# Patient Record
Sex: Female | Born: 1980
Health system: Southern US, Community
[De-identification: ages and names within clinical notes are randomized; demographics above are authoritative.]

## PROBLEM LIST (undated history)

## (undated) ENCOUNTER — Inpatient Hospital Stay (HOSPITAL_COMMUNITY): Payer: Self-pay

## (undated) DIAGNOSIS — R112 Nausea with vomiting, unspecified: Secondary | ICD-10-CM

## (undated) DIAGNOSIS — O139 Gestational [pregnancy-induced] hypertension without significant proteinuria, unspecified trimester: Secondary | ICD-10-CM

## (undated) DIAGNOSIS — Z9889 Other specified postprocedural states: Secondary | ICD-10-CM

## (undated) DIAGNOSIS — J189 Pneumonia, unspecified organism: Secondary | ICD-10-CM

## (undated) DIAGNOSIS — K589 Irritable bowel syndrome without diarrhea: Secondary | ICD-10-CM

## (undated) DIAGNOSIS — E229 Hyperfunction of pituitary gland, unspecified: Secondary | ICD-10-CM

## (undated) DIAGNOSIS — G43909 Migraine, unspecified, not intractable, without status migrainosus: Secondary | ICD-10-CM

## (undated) DIAGNOSIS — F419 Anxiety disorder, unspecified: Secondary | ICD-10-CM

## (undated) DIAGNOSIS — Z87442 Personal history of urinary calculi: Secondary | ICD-10-CM

## (undated) HISTORY — DX: Irritable bowel syndrome, unspecified: K58.9

## (undated) HISTORY — DX: Migraine, unspecified, not intractable, without status migrainosus: G43.909

## (undated) HISTORY — DX: Pneumonia, unspecified organism: J18.9

## (undated) HISTORY — DX: Anxiety disorder, unspecified: F41.9

## (undated) HISTORY — DX: Hyperfunction of pituitary gland, unspecified: E22.9

## (undated) HISTORY — PX: WISDOM TOOTH EXTRACTION: SHX21

## (undated) HISTORY — DX: Gestational (pregnancy-induced) hypertension without significant proteinuria, unspecified trimester: O13.9

---
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
## 2002-03-21 HISTORY — PX: FOOT FRACTURE SURGERY: SHX645

## 2002-06-24 ENCOUNTER — Other Ambulatory Visit: Admission: RE | Admit: 2002-06-24 | Discharge: 2002-06-24 | Payer: Self-pay | Admitting: Gynecology

## 2002-09-23 ENCOUNTER — Emergency Department (HOSPITAL_COMMUNITY): Admission: EM | Admit: 2002-09-23 | Discharge: 2002-09-24 | Payer: Self-pay | Admitting: Emergency Medicine

## 2002-09-26 ENCOUNTER — Encounter: Payer: Self-pay | Admitting: Family Medicine

## 2002-09-26 ENCOUNTER — Ambulatory Visit (HOSPITAL_COMMUNITY): Admission: RE | Admit: 2002-09-26 | Discharge: 2002-09-26 | Payer: Self-pay | Admitting: Family Medicine

## 2002-09-30 ENCOUNTER — Encounter: Payer: Self-pay | Admitting: Emergency Medicine

## 2002-09-30 ENCOUNTER — Emergency Department (HOSPITAL_COMMUNITY): Admission: EM | Admit: 2002-09-30 | Discharge: 2002-09-30 | Payer: Self-pay | Admitting: Emergency Medicine

## 2003-12-10 ENCOUNTER — Emergency Department (HOSPITAL_COMMUNITY): Admission: EM | Admit: 2003-12-10 | Discharge: 2003-12-10 | Payer: Self-pay | Admitting: *Deleted

## 2004-02-26 ENCOUNTER — Other Ambulatory Visit: Admission: RE | Admit: 2004-02-26 | Discharge: 2004-02-26 | Payer: Self-pay | Admitting: Gynecology

## 2006-10-11 ENCOUNTER — Other Ambulatory Visit: Admission: RE | Admit: 2006-10-11 | Discharge: 2006-10-11 | Payer: Self-pay | Admitting: Obstetrics and Gynecology

## 2006-10-20 DIAGNOSIS — R7989 Other specified abnormal findings of blood chemistry: Secondary | ICD-10-CM

## 2006-10-20 HISTORY — DX: Other specified abnormal findings of blood chemistry: R79.89

## 2006-11-12 ENCOUNTER — Encounter: Admission: RE | Admit: 2006-11-12 | Discharge: 2006-11-12 | Payer: Self-pay | Admitting: Obstetrics and Gynecology

## 2007-11-21 ENCOUNTER — Emergency Department (HOSPITAL_BASED_OUTPATIENT_CLINIC_OR_DEPARTMENT_OTHER): Admission: EM | Admit: 2007-11-21 | Discharge: 2007-11-21 | Payer: Self-pay | Admitting: Emergency Medicine

## 2008-02-08 ENCOUNTER — Ambulatory Visit: Payer: Self-pay | Admitting: Women's Health

## 2008-02-18 ENCOUNTER — Other Ambulatory Visit: Admission: RE | Admit: 2008-02-18 | Discharge: 2008-02-18 | Payer: Self-pay | Admitting: Obstetrics and Gynecology

## 2008-02-18 ENCOUNTER — Encounter: Payer: Self-pay | Admitting: Women's Health

## 2008-02-18 ENCOUNTER — Ambulatory Visit: Payer: Self-pay | Admitting: Women's Health

## 2008-03-21 DIAGNOSIS — J189 Pneumonia, unspecified organism: Secondary | ICD-10-CM

## 2008-03-21 HISTORY — DX: Pneumonia, unspecified organism: J18.9

## 2009-02-18 ENCOUNTER — Other Ambulatory Visit: Admission: RE | Admit: 2009-02-18 | Discharge: 2009-02-18 | Payer: Self-pay | Admitting: Obstetrics and Gynecology

## 2009-02-18 ENCOUNTER — Ambulatory Visit: Payer: Self-pay | Admitting: Women's Health

## 2010-02-19 ENCOUNTER — Other Ambulatory Visit
Admission: RE | Admit: 2010-02-19 | Discharge: 2010-02-19 | Payer: Self-pay | Source: Home / Self Care | Admitting: Obstetrics and Gynecology

## 2010-02-19 ENCOUNTER — Ambulatory Visit: Payer: Self-pay | Admitting: Women's Health

## 2010-04-10 ENCOUNTER — Encounter: Payer: Self-pay | Admitting: Orthopedic Surgery

## 2010-11-01 ENCOUNTER — Ambulatory Visit (INDEPENDENT_AMBULATORY_CARE_PROVIDER_SITE_OTHER): Payer: PRIVATE HEALTH INSURANCE | Admitting: Women's Health

## 2010-11-01 ENCOUNTER — Encounter: Payer: Self-pay | Admitting: Women's Health

## 2010-11-01 VITALS — BP 130/72

## 2010-11-01 DIAGNOSIS — N926 Irregular menstruation, unspecified: Secondary | ICD-10-CM

## 2010-11-01 DIAGNOSIS — O3680X0 Pregnancy with inconclusive fetal viability, not applicable or unspecified: Secondary | ICD-10-CM

## 2010-11-01 NOTE — Progress Notes (Signed)
Presents with a positive home U PT. Had been on Ortho Tri-Cyclen, but states may have missed 1 early in the pack of pills. States has felt tired, some breast tenderness denies any bleeding or pain. U PT here in the office is positive, we'll check an ultrasound at the end of the month for viability. Will continue on prenatal vitamin daily.Avoid tuna,  unpasteurized cheeses, over-the-counter medications or alcohol. She is aware we no longer deliver, and will seek prenatal care elsewhere. Congratulations were given.

## 2010-11-09 DIAGNOSIS — Z789 Other specified health status: Secondary | ICD-10-CM | POA: Insufficient documentation

## 2010-11-09 DIAGNOSIS — F419 Anxiety disorder, unspecified: Secondary | ICD-10-CM | POA: Insufficient documentation

## 2010-11-09 DIAGNOSIS — J189 Pneumonia, unspecified organism: Secondary | ICD-10-CM | POA: Insufficient documentation

## 2010-11-15 ENCOUNTER — Other Ambulatory Visit: Payer: Self-pay

## 2010-11-15 ENCOUNTER — Ambulatory Visit (INDEPENDENT_AMBULATORY_CARE_PROVIDER_SITE_OTHER): Payer: PRIVATE HEALTH INSURANCE | Admitting: Women's Health

## 2010-11-15 ENCOUNTER — Other Ambulatory Visit: Payer: PRIVATE HEALTH INSURANCE

## 2010-11-15 ENCOUNTER — Encounter: Payer: Self-pay | Admitting: Women's Health

## 2010-11-15 VITALS — BP 130/72

## 2010-11-15 DIAGNOSIS — O9989 Other specified diseases and conditions complicating pregnancy, childbirth and the puerperium: Secondary | ICD-10-CM

## 2010-11-15 DIAGNOSIS — N926 Irregular menstruation, unspecified: Secondary | ICD-10-CM

## 2010-11-15 DIAGNOSIS — O34519 Maternal care for incarceration of gravid uterus, unspecified trimester: Secondary | ICD-10-CM

## 2010-11-15 DIAGNOSIS — O3680X0 Pregnancy with inconclusive fetal viability, not applicable or unspecified: Secondary | ICD-10-CM

## 2010-11-15 DIAGNOSIS — N912 Amenorrhea, unspecified: Secondary | ICD-10-CM

## 2010-11-15 LAB — US OB TRANSVAGINAL

## 2010-11-15 NOTE — Progress Notes (Signed)
  Presents for viability ultrasound. No complaints of pain, cramping, bleeding.  Ultrasound confirms living IUP, Size =dates, [redacted]w[redacted]d,  normal shaped yoke sac seen, fetal pole with positive fetal heart noted. Copy was given she will schedule new OB appointment. She is planning to go to [facility] office.  Will continue prenatal vitamins, she is aware of safe behaviors in pregnancy, aware to avoid over-the-counter meds, no alcohol, avoid tuna, unpasteurized cheeses. Congratulations were given.

## 2010-11-25 ENCOUNTER — Other Ambulatory Visit (HOSPITAL_COMMUNITY): Payer: Self-pay | Admitting: Obstetrics and Gynecology

## 2010-11-25 DIAGNOSIS — O209 Hemorrhage in early pregnancy, unspecified: Secondary | ICD-10-CM

## 2010-11-26 ENCOUNTER — Ambulatory Visit (HOSPITAL_COMMUNITY)
Admission: RE | Admit: 2010-11-26 | Discharge: 2010-11-26 | Disposition: A | Discharge status: Home / Self Care | Payer: PRIVATE HEALTH INSURANCE | Source: Ambulatory Visit | Attending: Obstetrics and Gynecology | Admitting: Obstetrics and Gynecology

## 2010-11-26 DIAGNOSIS — O209 Hemorrhage in early pregnancy, unspecified: Secondary | ICD-10-CM | POA: Insufficient documentation

## 2010-12-08 ENCOUNTER — Encounter: Payer: Self-pay | Admitting: Women's Health

## 2010-12-13 LAB — ANTIBODY SCREEN: Antibody Screen: NEGATIVE

## 2010-12-13 LAB — HEPATITIS B SURFACE ANTIGEN: Hepatitis B Surface Ag: NEGATIVE

## 2010-12-13 LAB — RUBELLA ANTIBODY, IGM: Rubella: NON-IMMUNE/NOT IMMUNE

## 2010-12-13 LAB — ABO/RH: RH Type: NEGATIVE

## 2010-12-22 LAB — BASIC METABOLIC PANEL
Calcium: 9.8
GFR calc Af Amer: >60
GFR calc non Af Amer: >60
Glucose, Bld: 99
Sodium: 142

## 2010-12-22 LAB — POCT TOXICOLOGY PANEL: Benzodiazepines: POSITIVE

## 2010-12-22 LAB — DIFFERENTIAL
Basophils Absolute: 0
Lymphocytes Relative: 39
Monocytes Absolute: 0.5
Neutro Abs: 3.1

## 2010-12-22 LAB — GLUCOSE, CAPILLARY: Glucose-Capillary: 110 — ABNORMAL HIGH

## 2010-12-22 LAB — CBC
Hemoglobin: 14.1
RBC: 4.56
RDW: 12.2

## 2011-03-22 NOTE — L&D Delivery Note (Signed)
Delivery Note Pt pushed great for about an hour and at 8:48 PM a viable female was delivered via Vaginal, Spontaneous Delivery (Presentation: ; Occiput Anterior).  APGAR 6,9:  weight 7#14oz .  Baby had tight nuchal delivered through and was pale after delivery.  Responded great to stimulation and blow-by O2.. Placenta status: Intact, Spontaneous.  Cord: 3 vessels   Anesthesia: Epidural  Episiotomy: None Lacerations: 1st degree;Perineal Suture Repair: 2.0 vicryl rapide Est. Blood Loss (mL): 350cc  Mom to postpartum.  Baby to nursery-stable D/w pt circumcision in detail, risks and benfits reviewed--pt desires to proceed.  Oliver Pila 06/27/2011, 9:10 PM

## 2011-06-03 ENCOUNTER — Encounter (HOSPITAL_COMMUNITY): Payer: Self-pay | Admitting: Obstetrics and Gynecology

## 2011-06-03 ENCOUNTER — Inpatient Hospital Stay (HOSPITAL_COMMUNITY)
Admission: RE | Admit: 2011-06-03 | Discharge: 2011-06-03 | Disposition: A | Discharge status: Home / Self Care | Payer: PRIVATE HEALTH INSURANCE | Source: Ambulatory Visit | Attending: Obstetrics and Gynecology | Admitting: Obstetrics and Gynecology

## 2011-06-03 DIAGNOSIS — O139 Gestational [pregnancy-induced] hypertension without significant proteinuria, unspecified trimester: Secondary | ICD-10-CM | POA: Insufficient documentation

## 2011-06-03 DIAGNOSIS — IMO0002 Reserved for concepts with insufficient information to code with codable children: Secondary | ICD-10-CM

## 2011-06-03 LAB — COMPREHENSIVE METABOLIC PANEL
BUN: 6 mg/dL (ref 6–23)
CO2: 23 mEq/L (ref 19–32)
Chloride: 104 mEq/L (ref 96–112)
Creatinine, Ser: 0.53 mg/dL (ref 0.50–1.10)
GFR calc Af Amer: >90 mL/min (ref >90)
GFR calc non Af Amer: >90 mL/min (ref >90)
Glucose, Bld: 97 mg/dL (ref 70–99)
Total Bilirubin: 0.3 mg/dL (ref 0.3–1.2)

## 2011-06-03 LAB — URIC ACID: Uric Acid, Serum: 3.1 mg/dL (ref 2.4–7.0)

## 2011-06-03 LAB — URINALYSIS, ROUTINE W REFLEX MICROSCOPIC
Leukocytes, UA: NEGATIVE
Nitrite: NEGATIVE
Specific Gravity, Urine: <1.005 — ABNORMAL LOW (ref 1.005–1.030)
Urobilinogen, UA: 0.2 mg/dL (ref 0.0–1.0)
pH: 6.5 (ref 5.0–8.0)

## 2011-06-03 LAB — CBC
Platelets: 169 10*3/uL (ref 150–400)
RBC: 3.95 MIL/uL (ref 3.87–5.11)
WBC: 11.6 10*3/uL — ABNORMAL HIGH (ref 4.0–10.5)

## 2011-06-03 NOTE — MAU Note (Signed)
"  I have had elevated BP & H/A for the past (last Friday when I went to the doctor).  I went back yesterday because of my BP and it was still up.  My doctor told me to rest and that's what I have done all day today.  I took my BP this morning and it was 147/94.  Later on it was 172/90.  I called the doctor and he told me to rest for about 30 mins and then retake it.  It was then 170/96 and now I'm here.  No bleeding or vaginal d/c.  (+) FM."

## 2011-06-03 NOTE — MAU Provider Note (Signed)
History     CSN: [id number]  Arrival date and time: 06/03/11 2036   None     Chief Complaint  Patient presents with  . Hypertension   HPI This is a 31 year old G1 P0 at 35 weeks and 6 days who presents the MAU with elevated blood pressure. She took her blood pressure at home and found her blood pressure to be 170s systolically. She has had a headache for the majority of the day today and intermittently over the past week. She denies scotoma and peripheral edema. She denies decreased fetal activity, vaginal bleeding, vaginal discharge.  OB History    Grav Para Term Preterm Abortions TAB SAB Ect Mult Living   1         0      Past Medical History  Diagnosis Date  . Anxiety   . Pneumonia 2010  . Elevated prolactin level 10/2006    NEGATIVE MRI  . Rubella immune 02/2009    POSITIVE RUBELLA TITER    Past Surgical History  Procedure Date  . Tonsillectomy 1991  . Rt foot  2004    removed a bone from a fx foot    Family History  Problem Relation Age of Onset  . Hypertension Mother   . Diabetes Father   . Hypertension Father   . Diabetes Maternal Grandmother     History  Substance Use Topics  . Smoking status: Never Smoker   . Smokeless tobacco: Never Used  . Alcohol Use: No    Allergies: No Known Allergies  Prescriptions prior to admission  Medication Sig Dispense Refill  . acetaminophen (TYLENOL) 500 MG tablet Take 500 mg by mouth every 6 (six) hours as needed. For headache      . Calcium Carbonate Antacid (TUMS PO) Take 1 tablet by mouth daily.      . Prenatal Vit-Fe Fumarate-FA (PRENATAL MULTIVITAMIN) TABS Take 1 tablet by mouth daily.      . ranitidine (ZANTAC) 150 MG capsule Take 150 mg by mouth 2 (two) times daily.        ROS Physical Exam   Blood pressure 146/95, pulse 104, temperature 98.4 F (36.9 C), temperature source Oral, resp. rate 18, height 5\' 4"  (1.626 m), weight 73.483 kg (162 lb), last menstrual period 09/30/2010.  Physical Exam    Constitutional: She is oriented to person, place, and time. She appears well-developed and well-nourished.  Cardiovascular: Normal rate.   Respiratory: Effort normal.  GI: Soft. Bowel sounds are normal. She exhibits no distension and no mass. There is no tenderness. There is no rebound and no guarding.       Fundal height at term  Musculoskeletal: Normal range of motion. She exhibits no edema and no tenderness.  Neurological: She is alert and oriented to person, place, and time.  Skin: Skin is warm and dry. No rash noted. No erythema. No pallor.  Psychiatric: She has a normal mood and affect. Her behavior is normal. Judgment and thought content normal.   Results for orders placed during the hospital encounter of 06/03/11 (from the past 24 hour(s))  URINALYSIS, ROUTINE W REFLEX MICROSCOPIC     Status: Abnormal   Collection Time   06/03/11  8:35 PM      Component Value Range   Color, Urine YELLOW  YELLOW    APPearance CLEAR  CLEAR    Specific Gravity, Urine <1.005 (*) 1.005 - 1.030    pH 6.5  5.0 - 8.0    Glucose, UA NEGATIVE  NEGATIVE (mg/dL)   Hgb urine dipstick NEGATIVE  NEGATIVE    Bilirubin Urine NEGATIVE  NEGATIVE    Ketones, ur NEGATIVE  NEGATIVE (mg/dL)   Protein, ur NEGATIVE  NEGATIVE (mg/dL)   Urobilinogen, UA 0.2  0.0 - 1.0 (mg/dL)   Nitrite NEGATIVE  NEGATIVE    Leukocytes, UA NEGATIVE  NEGATIVE   COMPREHENSIVE METABOLIC PANEL     Status: Abnormal   Collection Time   06/03/11  9:13 PM      Component Value Range   Sodium 136  135 - 145 (mEq/L)   Potassium 3.7  3.5 - 5.1 (mEq/L)   Chloride 104  96 - 112 (mEq/L)   CO2 23  19 - 32 (mEq/L)   Glucose, Bld 97  70 - 99 (mg/dL)   BUN 6  6 - 23 (mg/dL)   Creatinine, Ser 4.09  0.50 - 1.10 (mg/dL)   Calcium 9.3  8.4 - 81.1 (mg/dL)   Total Protein 5.8 (*) 6.0 - 8.3 (g/dL)   Albumin 2.8 (*) 3.5 - 5.2 (g/dL)   AST 18  0 - 37 (U/L)   ALT 19  0 - 35 (U/L)   Alkaline Phosphatase 113  39 - 117 (U/L)   Total Bilirubin 0.3  0.3 - 1.2  (mg/dL)   GFR calc non Af Amer >90  >90 (mL/min)   GFR calc Af Amer >90  >90 (mL/min)  CBC     Status: Abnormal   Collection Time   06/03/11  9:13 PM      Component Value Range   WBC 11.6 (*) 4.0 - 10.5 (K/uL)   RBC 3.95  3.87 - 5.11 (MIL/uL)   Hemoglobin 12.1  12.0 - 15.0 (g/dL)   HCT 91.4 (*) 78.2 - 46.0 (%)   MCV 90.6  78.0 - 100.0 (fL)   MCH 30.6  26.0 - 34.0 (pg)   MCHC 33.8  30.0 - 36.0 (g/dL)   RDW 95.6  21.3 - 08.6 (%)   Platelets 169  150 - 400 (K/uL)  LACTATE DEHYDROGENASE     Status: Normal   Collection Time   06/03/11  9:13 PM      Component Value Range   LD 182  94 - 250 (U/L)  URIC ACID     Status: Normal   Collection Time   06/03/11  9:13 PM      Component Value Range   Uric Acid, Serum 3.1  2.4 - 7.0 (mg/dL)   NST shows Category 1 tracing with baseline approximately 140. Accelerations are noted. MAU Course  Procedures  MDM No proteinuria, LFTs are normal, uric acid and LDH are normal. Discussed patient with Dr. Ambrose Mantle ovary the patient does not meet criteria for preeclampsia. We'll send patient home with preeclampsia warnings. The patient to followup on Monday at primary obstetrician's office for an NST.   Assessment and Plan  #1 uterine pregnancy at 35 weeks and 6 days #2 gestational hypertension  We'll send patient home with precautions as stated above.  Lenardo Westwood JEHIEL 06/03/2011, 9:54 PM

## 2011-06-03 NOTE — Discharge Instructions (Signed)
Hypertension During Pregnancy Hypertension is also called high blood pressure. Blood pressure moves blood in your body. Sometimes, the force that moves the blood becomes too strong. When you are pregnant, this condition should be watched carefully. It can cause problems for you and your baby. HOME CARE   Make and keep all of your doctor visits.   Take medicine as told by your doctor. Tell your doctor about all medicines you take.   Eat very little salt.   Exercise regularly.   Do not drink alcohol.   Do not smoke.   Do not have drinks with caffeine.   Lie on your left side when resting.  GET HELP RIGHT AWAY IF:  You have bad belly (abdominal) pain.   You have sudden puffiness (swelling) in the hands, ankles, or face.   You gain 4 pounds (1.8 kilograms) or more in 1 week.   You throw up (vomit) repeatedly.   You have bleeding from the vagina.   You do not feel the baby moving as much.   You have a headache.   You have blurred or double vision.   You have muscle twitching or spasms.   You have shortness of breath.   You have blue fingernails and lips.   You have blood in your pee (urine).  MAKE SURE YOU:  Understand these instructions.   Will watch your condition.   Will get help right away if you are not doing well.  Document Released: 04/09/2010 Document Revised: 02/24/2011 Document Reviewed: 10/22/2010 [employer] Patient Information 2012 [employer]. 

## 2011-06-06 LAB — STREP B DNA PROBE: GBS: POSITIVE

## 2011-06-13 ENCOUNTER — Telehealth (HOSPITAL_COMMUNITY): Payer: Self-pay | Admitting: *Deleted

## 2011-06-13 ENCOUNTER — Encounter (HOSPITAL_COMMUNITY): Payer: Self-pay | Admitting: *Deleted

## 2011-06-13 NOTE — Telephone Encounter (Signed)
Preadmission screen  

## 2011-06-18 ENCOUNTER — Inpatient Hospital Stay (HOSPITAL_COMMUNITY)
Admission: AD | Admit: 2011-06-18 | Discharge: 2011-06-18 | Disposition: A | Discharge status: Home / Self Care | Payer: PRIVATE HEALTH INSURANCE | Source: Ambulatory Visit | Attending: Obstetrics and Gynecology | Admitting: Obstetrics and Gynecology

## 2011-06-18 ENCOUNTER — Encounter (HOSPITAL_COMMUNITY): Payer: Self-pay | Admitting: *Deleted

## 2011-06-18 DIAGNOSIS — R030 Elevated blood-pressure reading, without diagnosis of hypertension: Secondary | ICD-10-CM | POA: Insufficient documentation

## 2011-06-18 DIAGNOSIS — O99891 Other specified diseases and conditions complicating pregnancy: Secondary | ICD-10-CM | POA: Insufficient documentation

## 2011-06-18 DIAGNOSIS — M549 Dorsalgia, unspecified: Secondary | ICD-10-CM | POA: Insufficient documentation

## 2011-06-18 DIAGNOSIS — R51 Headache: Secondary | ICD-10-CM | POA: Insufficient documentation

## 2011-06-18 DIAGNOSIS — R42 Dizziness and giddiness: Secondary | ICD-10-CM | POA: Insufficient documentation

## 2011-06-18 LAB — LACTATE DEHYDROGENASE: LDH: 172 U/L (ref 94–250)

## 2011-06-18 LAB — URINALYSIS, ROUTINE W REFLEX MICROSCOPIC
Hgb urine dipstick: NEGATIVE
Ketones, ur: NEGATIVE mg/dL
Protein, ur: NEGATIVE mg/dL
Urobilinogen, UA: 0.2 mg/dL (ref 0.0–1.0)

## 2011-06-18 LAB — COMPREHENSIVE METABOLIC PANEL
AST: 19 U/L (ref 0–37)
Albumin: 2.9 g/dL — ABNORMAL LOW (ref 3.5–5.2)
BUN: 6 mg/dL (ref 6–23)
Calcium: 9.1 mg/dL (ref 8.4–10.5)
Chloride: 103 mEq/L (ref 96–112)
Creatinine, Ser: 0.44 mg/dL — ABNORMAL LOW (ref 0.50–1.10)
Total Protein: 6.4 g/dL (ref 6.0–8.3)

## 2011-06-18 LAB — URINE MICROSCOPIC-ADD ON

## 2011-06-18 LAB — CBC
HCT: 35.6 % — ABNORMAL LOW (ref 36.0–46.0)
MCHC: 34.6 g/dL (ref 30.0–36.0)
MCV: 89.9 fL (ref 78.0–100.0)
Platelets: 172 10*3/uL (ref 150–400)
RDW: 13.7 % (ref 11.5–15.5)
WBC: 12.4 10*3/uL — ABNORMAL HIGH (ref 4.0–10.5)

## 2011-06-18 LAB — URIC ACID: Uric Acid, Serum: 3 mg/dL (ref 2.4–7.0)

## 2011-06-18 NOTE — MAU Note (Signed)
Patient has been followed for elevated bp has headache , back pain and dizziness, has been on bed rest at home, 166/115 at home.  39 weeks

## 2011-06-18 NOTE — Discharge Instructions (Signed)

## 2011-06-18 NOTE — MAU Provider Note (Signed)
  History     CSN: [id number]  Arrival date and time: 06/18/11 1401   None     Chief Complaint  Patient presents with  . Hypertension  . Dizziness  . Back Pain  . Headache   HPI   Past Medical History  Diagnosis Date  . Anxiety   . Pneumonia 2010  . Elevated prolactin level 10/2006    NEGATIVE MRI  . Asthma     exercise induced  . IBS (irritable bowel syndrome)   . Chronic kidney disease     kidney stone  . Pregnancy induced hypertension     Past Surgical History  Procedure Date  . Tonsillectomy 1991  . Rt foot  2004    removed a bone from a fx foot  . Wisdom tooth extraction     Family History  Problem Relation Age of Onset  . Hypertension Mother   . Arthritis Mother   . Diabetes Father   . Hypertension Father   . Diabetes Maternal Grandmother   . COPD Maternal Grandmother   . Hypertension Maternal Grandmother   . Ovarian cysts Maternal Grandmother   . Arthritis Maternal Grandmother   . Hypertension Maternal Grandfather   . Hypertension Paternal Grandmother   . Cancer Paternal Grandmother     lung  . Hypertension Paternal Grandfather   . Muscular dystrophy Cousin   . Diabetes Paternal Aunt   . Anesthesia problems Neg Hx     History  Substance Use Topics  . Smoking status: Never Smoker   . Smokeless tobacco: Never Used  . Alcohol Use: No    Allergies: No Known Allergies  Prescriptions prior to admission  Medication Sig Dispense Refill  . acetaminophen (TYLENOL) 500 MG tablet Take 500 mg by mouth every 6 (six) hours as needed. For headache      . albuterol (PROVENTIL HFA;VENTOLIN HFA) 108 (90 BASE) MCG/ACT inhaler Inhale 2 puffs into the lungs every 6 (six) hours as needed. For shortness of breath      . Calcium Carbonate Antacid (TUMS PO) Take 1 tablet by mouth daily.      . Prenatal Vit-Fe Fumarate-FA (PRENATAL MULTIVITAMIN) TABS Take 1 tablet by mouth daily.      . ranitidine (ZANTAC) 150 MG capsule Take 150 mg by mouth 2 (two) times  daily.        ROS Physical Exam   Blood pressure 149/98, pulse 111, height 5\' 4"  (1.626 m), weight 77.837 kg (171 lb 9.6 oz), last menstrual period 09/30/2010.  Physical Exam Abdomen is soft and not tender. Cervix FT long and the vertex is at -3 station.  MAU Course  Procedures Serial BP's have returned to the levels she has had for some time. The labs are normal so she does not have preeclampsia.   MDM   Assessment and Plan  I have advised the pt to rest and call if she has a severe HA or epigastric pain. She is to return to see Dr. Senaida Ores on 06-20-11. She is to call if BP  160/100. Tyheim Vanalstyne F 06/18/2011, 3:42 PM

## 2011-06-18 NOTE — MAU Note (Signed)
Dr Ambrose Mantle called unit, labs read off to MD. Plan, will see pt again and send home.

## 2011-06-18 NOTE — MAU Note (Signed)
Pt reports having increasing in BP over past few weeks and today it was the highest at home. She reports having a headache all over there head with dizziness and spots only when looks at something close. She reports seeing "starburst" when up the bathroom. She states she is having a slight increase in swelling in her feet,legs, hands and face.

## 2011-06-18 NOTE — MAU Note (Signed)
Dr Ambrose Mantle at bedside discussing labs and discharge to home with pt and spouse.

## 2011-06-20 DIAGNOSIS — O139 Gestational [pregnancy-induced] hypertension without significant proteinuria, unspecified trimester: Secondary | ICD-10-CM

## 2011-06-20 HISTORY — DX: Gestational (pregnancy-induced) hypertension without significant proteinuria, unspecified trimester: O13.9

## 2011-06-21 ENCOUNTER — Encounter (HOSPITAL_COMMUNITY): Payer: Self-pay | Admitting: *Deleted

## 2011-06-21 ENCOUNTER — Telehealth (HOSPITAL_COMMUNITY): Payer: Self-pay | Admitting: *Deleted

## 2011-06-21 NOTE — Telephone Encounter (Signed)
Preadmission screen  

## 2011-06-22 ENCOUNTER — Inpatient Hospital Stay (HOSPITAL_COMMUNITY)
Admission: AD | Admit: 2011-06-22 | Discharge: 2011-06-22 | Disposition: A | Discharge status: Home / Self Care | Payer: PRIVATE HEALTH INSURANCE | Source: Ambulatory Visit | Attending: Obstetrics and Gynecology | Admitting: Obstetrics and Gynecology

## 2011-06-22 ENCOUNTER — Encounter (HOSPITAL_COMMUNITY): Payer: Self-pay | Admitting: *Deleted

## 2011-06-22 DIAGNOSIS — O479 False labor, unspecified: Secondary | ICD-10-CM | POA: Insufficient documentation

## 2011-06-22 NOTE — Discharge Instructions (Signed)
Call your doctor with any concerns or questions.  Return to MAU with any worsening symptoms.  

## 2011-06-22 NOTE — MAU Note (Signed)
Pt reports contractions all day, since 2:30 they have been q 5 minutes. Denies bleeding or ROM. Reports good fetal movement

## 2011-06-22 NOTE — Progress Notes (Signed)
Dr. Ambrose Mantle notified of pt presenting for labor check.  Notified of pt hx of elevated BP's.  MD familiar with pt and pt hx.  BP's given to md.  md states this is not a change for pt.  Orders to recheck cervix in one hour and if no change may dc home and follow up with regular scheduled appointment.

## 2011-06-22 NOTE — MAU Note (Signed)
Pt back from bathroom.  efm and toco replaced.  

## 2011-06-22 NOTE — MAU Note (Signed)
No cervical change noted.  Will dc home per MD orders.

## 2011-06-27 ENCOUNTER — Encounter (HOSPITAL_COMMUNITY): Payer: Self-pay | Admitting: Anesthesiology

## 2011-06-27 ENCOUNTER — Inpatient Hospital Stay (HOSPITAL_COMMUNITY)
Admission: RE | Admit: 2011-06-27 | Discharge: 2011-06-29 | DRG: 775 | Disposition: A | Discharge status: Home / Self Care | Payer: PRIVATE HEALTH INSURANCE | Source: Ambulatory Visit | Attending: Obstetrics and Gynecology | Admitting: Obstetrics and Gynecology

## 2011-06-27 ENCOUNTER — Encounter (HOSPITAL_COMMUNITY): Payer: Self-pay

## 2011-06-27 ENCOUNTER — Inpatient Hospital Stay (HOSPITAL_COMMUNITY): Payer: PRIVATE HEALTH INSURANCE | Admitting: Anesthesiology

## 2011-06-27 DIAGNOSIS — Z22330 Carrier of Group B streptococcus: Secondary | ICD-10-CM

## 2011-06-27 DIAGNOSIS — O99892 Other specified diseases and conditions complicating childbirth: Secondary | ICD-10-CM | POA: Diagnosis present

## 2011-06-27 DIAGNOSIS — O139 Gestational [pregnancy-induced] hypertension without significant proteinuria, unspecified trimester: Principal | ICD-10-CM | POA: Diagnosis present

## 2011-06-27 LAB — URINALYSIS, MICROSCOPIC ONLY
Bilirubin Urine: NEGATIVE
Hgb urine dipstick: NEGATIVE
Ketones, ur: NEGATIVE mg/dL
Nitrite: NEGATIVE
pH: 6.5 (ref 5.0–8.0)

## 2011-06-27 LAB — COMPREHENSIVE METABOLIC PANEL
AST: 21 U/L (ref 0–37)
Albumin: 2.8 g/dL — ABNORMAL LOW (ref 3.5–5.2)
BUN: 7 mg/dL (ref 6–23)
Calcium: 9.7 mg/dL (ref 8.4–10.5)
Chloride: 102 mEq/L (ref 96–112)
Creatinine, Ser: 0.45 mg/dL — ABNORMAL LOW (ref 0.50–1.10)
GFR calc non Af Amer: >90 mL/min (ref >90)
Total Bilirubin: 0.3 mg/dL (ref 0.3–1.2)

## 2011-06-27 LAB — CBC
MCH: 30.5 pg (ref 26.0–34.0)
MCHC: 33.6 g/dL (ref 30.0–36.0)
MCV: 90.8 fL (ref 78.0–100.0)
Platelets: 165 10*3/uL (ref 150–400)
RBC: 4 MIL/uL (ref 3.87–5.11)
RDW: 14 % (ref 11.5–15.5)

## 2011-06-27 LAB — RPR: RPR Ser Ql: NONREACTIVE

## 2011-06-27 LAB — LACTATE DEHYDROGENASE: LDH: 226 U/L (ref 94–250)

## 2011-06-27 MED ORDER — TERBUTALINE SULFATE 1 MG/ML IJ SOLN: 0.2500 mg | Freq: Once | INTRAMUSCULAR | Status: DC | PRN

## 2011-06-27 MED ORDER — ACETAMINOPHEN 325 MG PO TABS: 650.0000 mg | ORAL_TABLET | ORAL | Status: DC | PRN

## 2011-06-27 MED ORDER — SIMETHICONE 80 MG PO CHEW: 80.0000 mg | CHEWABLE_TABLET | ORAL | Status: DC | PRN

## 2011-06-27 MED ORDER — OXYCODONE-ACETAMINOPHEN 5-325 MG PO TABS
1.0000 | ORAL_TABLET | ORAL | Status: DC | PRN
  Filled 2011-06-27: qty 1
  Filled 2011-06-27: qty 2

## 2011-06-27 MED ORDER — SENNOSIDES-DOCUSATE SODIUM 8.6-50 MG PO TABS
2.0000 | ORAL_TABLET | Freq: Every day | ORAL | Status: DC
  Administered 2011-06-28: 2 via ORAL

## 2011-06-27 MED ORDER — EPHEDRINE 5 MG/ML INJ: 10.0000 mg | INTRAVENOUS | Status: DC | PRN

## 2011-06-27 MED ORDER — BENZOCAINE-MENTHOL 20-0.5 % EX AERO
1.0000 "application " | INHALATION_SPRAY | CUTANEOUS | Status: DC | PRN
  Administered 2011-06-28: 1 via TOPICAL

## 2011-06-27 MED ORDER — ALBUTEROL SULFATE HFA 108 (90 BASE) MCG/ACT IN AERS: 2.0000 | INHALATION_SPRAY | Freq: Four times a day (QID) | RESPIRATORY_TRACT | Status: DC | PRN

## 2011-06-27 MED ORDER — PENICILLIN G POTASSIUM 5000000 UNITS IJ SOLR
5.0000 10*6.[IU] | Freq: Once | INTRAVENOUS | Status: AC
  Administered 2011-06-27: 5 10*6.[IU] via INTRAVENOUS
  Filled 2011-06-27: qty 5

## 2011-06-27 MED ORDER — LACTATED RINGERS IV SOLN: 500.0000 mL | Freq: Once | INTRAVENOUS | Status: DC

## 2011-06-27 MED ORDER — OXYCODONE-ACETAMINOPHEN 5-325 MG PO TABS
1.0000 | ORAL_TABLET | ORAL | Status: DC | PRN
  Filled 2011-06-27: qty 2

## 2011-06-27 MED ORDER — FENTANYL 2.5 MCG/ML BUPIVACAINE 1/10 % EPIDURAL INFUSION (WH - ANES)
14.0000 mL/h | INTRAMUSCULAR | Status: DC
  Administered 2011-06-27 (×3): 14 mL/h via EPIDURAL
  Filled 2011-06-27 (×3): qty 60

## 2011-06-27 MED ORDER — WITCH HAZEL-GLYCERIN EX PADS: 1.0000 "application " | MEDICATED_PAD | CUTANEOUS | Status: DC | PRN

## 2011-06-27 MED ORDER — DIPHENHYDRAMINE HCL 25 MG PO CAPS: 25.0000 mg | ORAL_CAPSULE | Freq: Four times a day (QID) | ORAL | Status: DC | PRN

## 2011-06-27 MED ORDER — LIDOCAINE HCL (PF) 1 % IJ SOLN
INTRAMUSCULAR | Status: DC | PRN
  Administered 2011-06-27 (×3): 4 mL

## 2011-06-27 MED ORDER — LIDOCAINE HCL (PF) 1 % IJ SOLN
30.0000 mL | INTRAMUSCULAR | Status: DC | PRN
  Filled 2011-06-27: qty 30

## 2011-06-27 MED ORDER — TETANUS-DIPHTH-ACELL PERTUSSIS 5-2.5-18.5 LF-MCG/0.5 IM SUSP: 0.5000 mL | Freq: Once | INTRAMUSCULAR | Status: DC

## 2011-06-27 MED ORDER — IBUPROFEN 600 MG PO TABS
600.0000 mg | ORAL_TABLET | Freq: Four times a day (QID) | ORAL | Status: DC | PRN
  Administered 2011-06-27: 600 mg via ORAL
  Filled 2011-06-27: qty 1

## 2011-06-27 MED ORDER — OXYTOCIN BOLUS FROM INFUSION
500.0000 mL | Freq: Once | INTRAVENOUS | Status: AC
  Administered 2011-06-27: 500 mL via INTRAVENOUS
  Filled 2011-06-27: qty 500

## 2011-06-27 MED ORDER — PHENYLEPHRINE 40 MCG/ML (10ML) SYRINGE FOR IV PUSH (FOR BLOOD PRESSURE SUPPORT)
80.0000 ug | PREFILLED_SYRINGE | INTRAVENOUS | Status: DC | PRN
  Filled 2011-06-27: qty 5

## 2011-06-27 MED ORDER — CITRIC ACID-SODIUM CITRATE 334-500 MG/5ML PO SOLN
30.0000 mL | ORAL | Status: DC | PRN
  Administered 2011-06-27: 30 mL via ORAL
  Filled 2011-06-27: qty 15

## 2011-06-27 MED ORDER — OXYTOCIN 20 UNITS IN LACTATED RINGERS INFUSION - SIMPLE
1.0000 m[IU]/min | INTRAVENOUS | Status: DC
  Administered 2011-06-27: 2 m[IU]/min via INTRAVENOUS

## 2011-06-27 MED ORDER — ONDANSETRON HCL 4 MG PO TABS: 4.0000 mg | ORAL_TABLET | ORAL | Status: DC | PRN

## 2011-06-27 MED ORDER — PRENATAL MULTIVITAMIN CH
1.0000 | ORAL_TABLET | Freq: Every day | ORAL | Status: DC
  Administered 2011-06-28 – 2011-06-29 (×2): 1 via ORAL
  Filled 2011-06-27 (×2): qty 1

## 2011-06-27 MED ORDER — PENICILLIN G POTASSIUM 5000000 UNITS IJ SOLR
2.5000 10*6.[IU] | INTRAVENOUS | Status: DC
  Administered 2011-06-27 (×3): 2.5 10*6.[IU] via INTRAVENOUS
  Filled 2011-06-27 (×5): qty 2.5

## 2011-06-27 MED ORDER — LANOLIN HYDROUS EX OINT: TOPICAL_OINTMENT | CUTANEOUS | Status: DC | PRN

## 2011-06-27 MED ORDER — DIPHENHYDRAMINE HCL 50 MG/ML IJ SOLN: 12.5000 mg | INTRAMUSCULAR | Status: DC | PRN

## 2011-06-27 MED ORDER — IBUPROFEN 600 MG PO TABS
600.0000 mg | ORAL_TABLET | Freq: Four times a day (QID) | ORAL | Status: DC
  Administered 2011-06-28 – 2011-06-29 (×5): 600 mg via ORAL
  Filled 2011-06-27 (×6): qty 1

## 2011-06-27 MED ORDER — EPHEDRINE 5 MG/ML INJ
10.0000 mg | INTRAVENOUS | Status: DC | PRN
  Filled 2011-06-27: qty 4

## 2011-06-27 MED ORDER — DIBUCAINE 1 % RE OINT: 1.0000 "application " | TOPICAL_OINTMENT | RECTAL | Status: DC | PRN

## 2011-06-27 MED ORDER — PHENYLEPHRINE 40 MCG/ML (10ML) SYRINGE FOR IV PUSH (FOR BLOOD PRESSURE SUPPORT): 80.0000 ug | PREFILLED_SYRINGE | INTRAVENOUS | Status: DC | PRN

## 2011-06-27 MED ORDER — OXYTOCIN 20 UNITS IN LACTATED RINGERS INFUSION - SIMPLE
125.0000 mL/h | INTRAVENOUS | Status: DC
  Administered 2011-06-27: 6 [IU] via INTRAVENOUS
  Filled 2011-06-27: qty 1000

## 2011-06-27 MED ORDER — OXYCODONE-ACETAMINOPHEN 5-325 MG PO TABS
1.0000 | ORAL_TABLET | Freq: Once | ORAL | Status: AC
  Administered 2011-06-27: 1 via ORAL

## 2011-06-27 MED ORDER — LACTATED RINGERS IV SOLN
INTRAVENOUS | Status: DC
  Administered 2011-06-27: 1000 mL via INTRAVENOUS
  Administered 2011-06-27: 20:00:00 via INTRAVENOUS

## 2011-06-27 MED ORDER — ONDANSETRON HCL 4 MG/2ML IJ SOLN: 4.0000 mg | INTRAMUSCULAR | Status: DC | PRN

## 2011-06-27 MED ORDER — LACTATED RINGERS IV SOLN
500.0000 mL | INTRAVENOUS | Status: DC | PRN
  Administered 2011-06-27: 1000 mL via INTRAVENOUS

## 2011-06-27 MED ORDER — FLEET ENEMA 7-19 GM/118ML RE ENEM: 1.0000 | ENEMA | RECTAL | Status: DC | PRN

## 2011-06-27 MED ORDER — OXYTOCIN 20 UNITS IN LACTATED RINGERS INFUSION - SIMPLE: 125.0000 mL/h | Freq: Once | INTRAVENOUS | Status: DC

## 2011-06-27 MED ORDER — ONDANSETRON HCL 4 MG/2ML IJ SOLN
4.0000 mg | Freq: Four times a day (QID) | INTRAMUSCULAR | Status: DC | PRN
  Administered 2011-06-27: 4 mg via INTRAVENOUS
  Filled 2011-06-27: qty 2

## 2011-06-27 MED ORDER — ZOLPIDEM TARTRATE 5 MG PO TABS: 5.0000 mg | ORAL_TABLET | Freq: Every evening | ORAL | Status: DC | PRN

## 2011-06-27 NOTE — H&P (Signed)
Andrea Boyer is a 31 y.o. female G1P0 at 52 2/7 weeks (EDD 07/04/11 by LMP c/w 8 week [place])  presenting for induction of labor for gestational hypertension.  Pt has had elevated BP to 130-150-90-100 for several weeks.  No symptoms of preeclampsia with negative proteinuria and negative labs. NST's have been reactive 2x weekly.  Prenatal care otherwise uneventful except GBS positive.    Maternal Medical History:  Fetal activity: Perceived fetal activity is normal.    Prenatal complications: Hypertension.     OB History    Grav Para Term Preterm Abortions TAB SAB Ect Mult Living   1         0     Past Medical History  Diagnosis Date  . Anxiety   . Pneumonia 2010  . Elevated prolactin level 10/2006    NEGATIVE MRI  . Asthma     exercise induced  . IBS (irritable bowel syndrome)   . Chronic kidney disease     kidney stone  . Pregnancy induced hypertension    Past Surgical History  Procedure Date  . Tonsillectomy 1991  . Rt foot  2004    removed a bone from a fx foot  . Wisdom tooth extraction    Family History: family history includes Arthritis in her maternal grandmother and mother; COPD in her maternal grandmother; Cancer in her paternal grandmother; Diabetes in her father, maternal grandmother, and paternal aunt; Hypertension in her father, maternal grandfather, maternal grandmother, mother, paternal grandfather, and paternal grandmother; Muscular dystrophy in her cousin; and Ovarian cysts in her maternal grandmother.  There is no history of Anesthesia problems. Social History:  reports that she has never smoked. She has never used smokeless tobacco. She reports that she does not drink alcohol or use illicit drugs.  Review of Systems  Eyes: Negative for blurred vision.  Neurological: Negative for headaches.    Dilation: 2.5 Effacement (%): 60 Station: -2 Exam by:: D Herr rn Last menstrual period 09/30/2010. Maternal Exam:  Uterine Assessment: Contraction strength is mild.   Contraction frequency is irregular.   Abdomen: Patient reports no abdominal tenderness. Fetal presentation: vertex  Introitus: Normal vulva. Normal vagina.    Physical Exam  Constitutional: She is oriented to person, place, and time. She appears well-developed and well-nourished.  Cardiovascular: Normal rate and regular rhythm.   Respiratory: Effort normal and breath sounds normal.  GI: Soft. Bowel sounds are normal.  Genitourinary: Vagina normal and uterus normal.       50/2-3/-2 AROM clear  Neurological: She is alert and oriented to person, place, and time.  Psychiatric: She has a normal mood and affect. Her behavior is normal.    Prenatal labs: ABO, Rh: A/Negative/-- (09/24 0000) Antibody: Negative (09/24 0000) Rubella: Nonimmune (09/24 0000) RPR: Nonreactive (09/24 0000)  HBsAg: Negative (09/24 0000)  HIV: Non-reactive (09/24 0000)  GBS: Positive (03/18 0000)  One hour GTT 80 Declined genetic screens  Assessment/Plan: Pt with favorable cervix for induction given gestational hypertension.  She is already on PCN for +GBS.  Pitocin induction, will check labs to confirm still WNL.   Oliver Pila 06/27/2011, 8:31 AM

## 2011-06-27 NOTE — Anesthesia Preprocedure Evaluation (Signed)
Anesthesia Evaluation  Patient identified by MRN, date of birth, ID band Patient awake    Reviewed: Allergy & Precautions, H&P , NPO status , Patient's Chart, lab work & pertinent test results, reviewed documented beta blocker date and time   History of Anesthesia Complications Negative for: history of anesthetic complications  Airway Mallampati: II TM Distance: >3 FB Neck ROM: full    Dental  (+) Teeth Intact   Pulmonary asthma (last inhaler use yesterday, uses every other day) , pneumonia  (2010),  breath sounds clear to auscultation        Cardiovascular hypertension (labile, PIH, labs normal), Rhythm:regular Rate:Normal     Neuro/Psych PSYCHIATRIC DISORDERS (anxiety) negative neurological ROS     GI/Hepatic negative GI ROS, Neg liver ROS,   Endo/Other  negative endocrine ROS  Renal/GU Kidney stone     Musculoskeletal   Abdominal   Peds  Hematology negative hematology ROS (+)   Anesthesia Other Findings   Reproductive/Obstetrics (+) Pregnancy                           Anesthesia Physical Anesthesia Plan  ASA: II  Anesthesia Plan: Epidural   Post-op Pain Management:    Induction:   Airway Management Planned:   Additional Equipment:   Intra-op Plan:   Post-operative Plan:   Informed Consent: I have reviewed the patients History and Physical, chart, labs and discussed the procedure including the risks, benefits and alternatives for the proposed anesthesia with the patient or authorized representative who has indicated his/her understanding and acceptance.     Plan Discussed with:   Anesthesia Plan Comments:         Anesthesia Quick Evaluation

## 2011-06-27 NOTE — Progress Notes (Signed)
Patient ID: Andrea Boyer, female   DOB: October 06, 1980, 31 y.o.   MRN: [id number] Pt now completely dilated and +1 station.  Will start pushing.

## 2011-06-27 NOTE — Progress Notes (Addendum)
   Subjective: Pt c/o some HA, feels better with icepack on her neck.  Given percocet x 1.  BP improved after epidural.  Objective: BP 120/85  Pulse 86  Temp(Src) 99.1 F (37.3 C) (Oral)  SpO2 100%  LMP 09/30/2010      FHT:  FHR: 140 bpm, variability: moderate,  accelerations:  Present,  decelerations:  Absent UC:   irregular, every 3-4 minutes SVE:   Dilation: 5 Effacement (%): 70 Station: -2 Exam by:: Dr Senaida Ores IUPC placed  Labs: Lab Results  Component Value Date   WBC 10.7* 06/27/2011   HGB 12.2 06/27/2011   HCT 36.3 06/27/2011   MCV 90.8 06/27/2011   PLT 165 06/27/2011    Assessment / Plan: IUPC placed to adjust pitocin, pt making progress.   PIH labs WNL, awaiting [place] for protein. Oliver Pila 06/27/2011, 1:26 PM

## 2011-06-27 NOTE — Progress Notes (Signed)
Patient ID: Andrea Boyer, female   DOB: Apr 28, 1980, 31 y.o.   MRN: [id number] Pt with rim left and station improved to +1 station.  HA improved.  Will check again in 64min-hour and start pushing FHR reassuring with no decels

## 2011-06-27 NOTE — Anesthesia Procedure Notes (Signed)
Epidural Patient location during procedure: OB Start time: 06/27/2011 10:58 AM Reason for block: procedure for pain  Staffing Performed by: anesthesiologist   Preanesthetic Checklist Completed: patient identified, site marked, surgical consent, pre-op evaluation, timeout performed, IV checked, risks and benefits discussed and monitors and equipment checked  Epidural Patient position: sitting Prep: site prepped and draped and DuraPrep Patient monitoring: continuous pulse ox and blood pressure Approach: midline Injection technique: LOR air  Needle:  Needle type: Tuohy  Needle gauge: 17 G Needle length: 9 cm Needle insertion depth: 5 cm cm Catheter type: closed end flexible Catheter size: 19 Gauge Catheter at skin depth: 10 cm Test dose: negative  Assessment Events: blood not aspirated, injection not painful, no injection resistance, negative IV test and no paresthesia  Additional Notes Discussed risk of headache, infection, bleeding, nerve injury and failed or incomplete block.  Patient voices understanding and wishes to proceed.

## 2011-06-27 NOTE — Progress Notes (Signed)
   Subjective: Still with intermittent HA Objective: BP 132/81  Pulse 112  Temp(Src) 98.8 F (37.1 C) (Oral)  Resp 16  SpO2 100%  LMP 09/30/2010      FHT:  FHR: 135 bpm, variability: moderate,  accelerations:  Present,  decelerations:  Absent UC:   regular, every 3 minutes SVE:   Dilation: 9 Effacement (%): 100 Station: 0 Exam by:: dr Senaida Ores PT at 9 cm, still a 0 station  Labs: Lab Results  Component Value Date   WBC 10.7* 06/27/2011   HGB 12.2 06/27/2011   HCT 36.3 06/27/2011   MCV 90.8 06/27/2011   PLT 165 06/27/2011    Assessment / Plan: Rotated in exaggerated Sims, will recheck in an hour Samanda Buske W 06/27/2011, 5:26 PM

## 2011-06-28 LAB — COMPREHENSIVE METABOLIC PANEL
ALT: 14 U/L (ref 0–35)
Alkaline Phosphatase: 117 U/L (ref 39–117)
CO2: 25 mEq/L (ref 19–32)
Chloride: 103 mEq/L (ref 96–112)
GFR calc Af Amer: >90 mL/min (ref >90)
GFR calc non Af Amer: >90 mL/min (ref >90)
Glucose, Bld: 93 mg/dL (ref 70–99)
Potassium: 3.5 mEq/L (ref 3.5–5.1)
Sodium: 137 mEq/L (ref 135–145)
Total Bilirubin: 0.3 mg/dL (ref 0.3–1.2)
Total Protein: 4.8 g/dL — ABNORMAL LOW (ref 6.0–8.3)

## 2011-06-28 LAB — CBC
HCT: 32.6 % — ABNORMAL LOW (ref 36.0–46.0)
Hemoglobin: 10.6 g/dL — ABNORMAL LOW (ref 12.0–15.0)
RBC: 3.54 MIL/uL — ABNORMAL LOW (ref 3.87–5.11)

## 2011-06-28 MED ORDER — BENZOCAINE-MENTHOL 20-0.5 % EX AERO
INHALATION_SPRAY | CUTANEOUS | Status: AC
  Filled 2011-06-28: qty 56

## 2011-06-28 NOTE — Anesthesia Postprocedure Evaluation (Signed)
  Anesthesia Post-op Note  Patient: Andrea Boyer  Procedure(s) Performed: * No procedures listed *  Patient Location: PACU and Mother/Baby  Anesthesia Type: Epidural  Level of Consciousness: awake, alert  and oriented  Airway and Oxygen Therapy: Patient Spontanous Breathing  Post-op Pain: mild  Post-op Assessment: Patient's Cardiovascular Status Stable, Respiratory Function Stable, No signs of Nausea or vomiting, Adequate PO intake, Pain level controlled, No headache, No backache, No residual numbness and No residual motor weakness  Post-op Vital Signs: stable  Complications: No apparent anesthesia complications

## 2011-06-28 NOTE — Progress Notes (Signed)
Post Partum Day 1 Subjective: no complaints, voiding and tolerating PO  Objective: Blood pressure 118/70, pulse 96, temperature 98 F (36.7 C), temperature source Oral, resp. rate 18, height 5\' 4"  (1.626 m), weight 78.019 kg (172 lb), last menstrual period 09/30/2010, SpO2 99.00%, unknown if currently breastfeeding.  Physical Exam:  General: alert Lochia: appropriate Uterine Fundus: firm  Basename 06/28/11 0520 06/27/11 0726  HGB 10.6* 12.2  HCT 32.6* 36.3    Assessment/Plan: BP WNL since delivery and labs remain WNL, continue care.   LOS: 1 day   Astoria Condon W 06/28/2011, 7:56 AM

## 2011-06-29 ENCOUNTER — Inpatient Hospital Stay (HOSPITAL_COMMUNITY)
Admission: AD | Admit: 2011-06-29 | Discharge: 2011-06-30 | Disposition: A | Discharge status: Home / Self Care | Payer: PRIVATE HEALTH INSURANCE | Source: Ambulatory Visit | Attending: Obstetrics and Gynecology | Admitting: Obstetrics and Gynecology

## 2011-06-29 DIAGNOSIS — O139 Gestational [pregnancy-induced] hypertension without significant proteinuria, unspecified trimester: Secondary | ICD-10-CM

## 2011-06-29 DIAGNOSIS — O135 Gestational [pregnancy-induced] hypertension without significant proteinuria, complicating the puerperium: Secondary | ICD-10-CM | POA: Insufficient documentation

## 2011-06-29 DIAGNOSIS — IMO0002 Reserved for concepts with insufficient information to code with codable children: Secondary | ICD-10-CM

## 2011-06-29 DIAGNOSIS — R42 Dizziness and giddiness: Secondary | ICD-10-CM | POA: Insufficient documentation

## 2011-06-29 LAB — COMPREHENSIVE METABOLIC PANEL
AST: 21 U/L (ref 0–37)
Albumin: 2.6 g/dL — ABNORMAL LOW (ref 3.5–5.2)
Calcium: 9.4 mg/dL (ref 8.4–10.5)
Chloride: 103 mEq/L (ref 96–112)
Creatinine, Ser: 0.56 mg/dL (ref 0.50–1.10)
Total Bilirubin: 0.3 mg/dL (ref 0.3–1.2)

## 2011-06-29 LAB — CBC
MCV: 91.9 fL (ref 78.0–100.0)
Platelets: 155 10*3/uL (ref 150–400)
RDW: 14 % (ref 11.5–15.5)
WBC: 10.4 10*3/uL (ref 4.0–10.5)

## 2011-06-29 MED ORDER — IBUPROFEN 600 MG PO TABS: 600.0000 mg | ORAL_TABLET | Freq: Four times a day (QID) | ORAL | Status: AC

## 2011-06-29 MED ORDER — RHO D IMMUNE GLOBULIN 1500 UNIT/2ML IJ SOLN
300.0000 ug | Freq: Once | INTRAMUSCULAR | Status: AC
  Administered 2011-06-29: 300 ug via INTRAMUSCULAR
  Filled 2011-06-29: qty 2

## 2011-06-29 NOTE — Progress Notes (Addendum)
Post Partum Day 2 Subjective: no complaints, up ad lib and tolerating PO  Objective: Blood pressure 122/77, pulse 97, temperature 98.2 F (36.8 C), temperature source Oral, resp. rate 20, height 5\' 4"  (1.626 m), weight 78.019 kg (172 lb), last menstrual period 09/30/2010, SpO2 99.00%, unknown if currently breastfeeding.  Physical Exam:  General: alert Lochia: appropriate Uterine Fundus: firm [id number]}   Basename 06/28/11 0520 06/27/11 0726  HGB 10.6* 12.2  HCT 32.6* 36.3    Assessment/Plan: Discharge home Motrin F/u 6 weeks   LOS: 2 days   Jisell Majer W 06/29/2011, 11:10 AM

## 2011-06-29 NOTE — Discharge Summary (Signed)
Obstetric Discharge Summary Reason for Admission: induction of labor Prenatal Procedures: none Intrapartum Procedures: spontaneous vaginal delivery Postpartum Procedures: Rho(D) Ig Complications-Operative and Postpartum: first degree perineal laceration Hemoglobin  Date Value Range Status  06/28/2011 10.6* 12.0-15.0 (g/dL) Final     HCT  Date Value Range Status  06/28/2011 32.6* 36.0-46.0 (%) Final    Physical Exam:  General: alert Lochia: appropriate Uterine Fundus: firm   Discharge Diagnoses: Term Pregnancy-delivered                                         Gestational Hypertension Discharge Information: Date: 06/29/2011 Activity: pelvic rest Diet: routine Medications: Ibuprofen Condition: improved Instructions: refer to practice specific booklet Discharge to: home   Newborn Data: Live born female  Birth Weight: 7 lb 14 oz (3572 g) APGAR: 6, 9  Home with mother.  Andrea Boyer 06/29/2011, 11:12 AM

## 2011-06-30 LAB — RH IG WORKUP (INCLUDES ABO/RH)
Fetal Screen: NEGATIVE
Unit division: 0

## 2011-06-30 NOTE — MAU Provider Note (Signed)
History     CSN: [id number]  Arrival date and time: 06/29/11 2237   First Provider Initiated Contact with Patient 06/29/11 2252      No chief complaint on file.  HPI This is a 31 y.o. who had a SVD on 06/27/11 presents with c/o dizziness and feeling disoriented once after getting out of shower tonight. States BP has been elevated and she wanted it checked. Has been feeling hot and cold with chills. Denies pain or excessive bleeding. States legs are swollen.   OB History    Grav Para Term Preterm Abortions TAB SAB Ect Mult Living   1 1 1       1       Past Medical History  Diagnosis Date  . Anxiety   . Pneumonia 2010  . Elevated prolactin level 10/2006    NEGATIVE MRI  . Asthma     exercise induced  . IBS (irritable bowel syndrome)   . Chronic kidney disease     kidney stone  . Pregnancy induced hypertension     Past Surgical History  Procedure Date  . Tonsillectomy 1991  . Rt foot  2004    removed a bone from a fx foot  . Wisdom tooth extraction     Family History  Problem Relation Age of Onset  . Hypertension Mother   . Arthritis Mother   . Diabetes Father   . Hypertension Father   . Diabetes Maternal Grandmother   . COPD Maternal Grandmother   . Hypertension Maternal Grandmother   . Ovarian cysts Maternal Grandmother   . Arthritis Maternal Grandmother   . Hypertension Maternal Grandfather   . Hypertension Paternal Grandmother   . Cancer Paternal Grandmother     lung  . Hypertension Paternal Grandfather   . Muscular dystrophy Cousin   . Diabetes Paternal Aunt   . Anesthesia problems Neg Hx     History  Substance Use Topics  . Smoking status: Never Smoker   . Smokeless tobacco: Never Used  . Alcohol Use: No    Allergies: No Known Allergies  Prescriptions prior to admission  Medication Sig Dispense Refill  . acetaminophen (TYLENOL) 500 MG tablet Take 500 mg by mouth every 6 (six) hours as needed. For headache      . Calcium Carbonate Antacid  (TUMS PO) Take 2 tablets by mouth daily.       Marland Kitchen ibuprofen (ADVIL,MOTRIN) 600 MG tablet Take 1 tablet (600 mg total) by mouth every 6 (six) hours.  30 tablet  1  . Prenatal Vit-Fe Fumarate-FA (PRENATAL MULTIVITAMIN) TABS Take 1 tablet by mouth daily.      . ranitidine (ZANTAC) 150 MG capsule Take 150 mg by mouth 2 (two) times daily.      Marland Kitchen albuterol (PROVENTIL HFA;VENTOLIN HFA) 108 (90 BASE) MCG/ACT inhaler Inhale 2 puffs into the lungs every 6 (six) hours as needed. For shortness of breath        ROS As listed above in HPI  Physical Exam   Blood pressure 135/82, pulse 92, temperature 99.1 F (37.3 C), temperature source Oral, resp. rate 18, last menstrual period 09/30/2010, SpO2 100.00%, unknown if currently breastfeeding. Filed Vitals:   06/29/11 2242 06/29/11 2254 06/29/11 2257  BP: 147/90 137/88 135/82  Pulse: 92 92 92  Temp: 99.1 F (37.3 C)    TempSrc: Oral    Resp: 18 20 18   SpO2: 100%      Physical Exam  Constitutional: She is oriented to person, place, and  time. She appears well-developed and well-nourished. No distress (Though does appear anxious at times).  HENT:  Head: Normocephalic.  Cardiovascular: Normal rate and regular rhythm.   Respiratory: No respiratory distress. She has no wheezes. She has no rales.  GI: Soft. She exhibits no distension. There is no tenderness. There is no rebound and no guarding.  Musculoskeletal: Normal range of motion. She exhibits edema (1+ lower legs). She exhibits no tenderness.  Neurological: She is alert and oriented to person, place, and time. She has normal reflexes. She displays normal reflexes.  Skin: Skin is warm and dry.  Psychiatric: She has a normal mood and affect.   Results for orders placed during the hospital encounter of 06/29/11 (from the past 24 hour(s))  CBC     Status: Abnormal   Collection Time   06/29/11 11:20 PM      Component Value Range   WBC 10.4  4.0 - 10.5 (K/uL)   RBC 3.59 (*) 3.87 - 5.11 (MIL/uL)    Hemoglobin 10.9 (*) 12.0 - 15.0 (g/dL)   HCT 16.1 (*) 09.6 - 46.0 (%)   MCV 91.9  78.0 - 100.0 (fL)   MCH 30.4  26.0 - 34.0 (pg)   MCHC 33.0  30.0 - 36.0 (g/dL)   RDW 04.5  40.9 - 81.1 (%)   Platelets 155  150 - 400 (K/uL)  COMPREHENSIVE METABOLIC PANEL     Status: Abnormal   Collection Time   06/29/11 11:20 PM      Component Value Range   Sodium 139  135 - 145 (mEq/L)   Potassium 4.0  3.5 - 5.1 (mEq/L)   Chloride 103  96 - 112 (mEq/L)   CO2 25  19 - 32 (mEq/L)   Glucose, Bld 84  70 - 99 (mg/dL)   BUN 10  6 - 23 (mg/dL)   Creatinine, Ser 9.14  0.50 - 1.10 (mg/dL)   Calcium 9.4  8.4 - 78.2 (mg/dL)   Total Protein 5.5 (*) 6.0 - 8.3 (g/dL)   Albumin 2.6 (*) 3.5 - 5.2 (g/dL)   AST 21  0 - 37 (U/L)   ALT 17  0 - 35 (U/L)   Alkaline Phosphatase 107  39 - 117 (U/L)   Total Bilirubin 0.3  0.3 - 1.2 (mg/dL)   GFR calc non Af Amer >90  >90 (mL/min)   GFR calc Af Amer >90  >90 (mL/min)    MAU Course  Procedures   Assessment and Plan  A:  Postpartum Day #2      Gestational labile hypertension      No evidence of preeclampsia      Anxiety      Probable vertigo vs vagal reaction, now resolved P:  Discussed with Dr Ambrose Mantle      Discharge home      Reassurance given     Discussed slow change in position, good hydration and rest     Followup in office  [facility] 06/30/2011, 12:21 AM

## 2011-06-30 NOTE — Discharge Instructions (Signed)
Hypertension During Pregnancy Hypertension is also called high blood pressure. It can occur at any time in life and during pregnancy. When you have hypertension, there is extra pressure inside your blood vessels that carry blood from the heart to the rest of your body (arteries). Hypertension during pregnancy can cause problems for you and your baby. Your baby might not weigh as much as it should at birth or might be born early (premature). Very bad cases of hypertension during pregnancy can be life-threatening.  There are different types of hypertension during pregnancy.   Chronic hypertension. This happens when a woman has hypertension before pregnancy and it continues during pregnancy.   Gestational hypertension. This is when hypertension develops during pregnancy.   Preeclampsia or toxemia of pregnancy. This is a very serious type of hypertension that develops only during pregnancy. It is a disease that affects the whole body (systemic) and can be very dangerous for both mother and baby.   Gestational hypertension and preeclampsia usually go away after your baby is born. Blood pressure generally stabilizes within 6 weeks. Women who have hypertension during pregnancy have a greater chance of developing hypertension later in life or with future pregnancies. UNDERSTANDING BLOOD PRESSURE Blood pressure moves blood in your body. Sometimes, the force that moves the blood becomes too strong.  A blood pressure reading is given in 2 numbers and looks like a fraction.   The top number is called the systolic pressure. When your heart beats, it forces more blood to flow through the arteries. Pressure inside the arteries goes up.   The bottom number is the diastolic pressure. Pressure goes down between beats. That is when the heart is resting.   You may have hypertension if:   Your systolic blood pressure is above 140.   Your diastolic pressure is above 90.  RISK FACTORS Some factors make you more  likely to develop hypertension during pregnancy. Risk factors include:  Having hypertension before pregnancy.   Having hypertension during a previous pregnancy.   Being overweight.   Being older than 40.   Being pregnant with more than 1 baby (multiples).   Having diabetes or kidney problems.  SYMPTOMS Chronic and gestational hypertension may not cause symptoms. Preeclampsia has symptoms, which may include:  Increased protein in your urine. Your caregiver will check for this at every prenatal visit.   Swelling of your hands and face.   Rapid weight gain.   Headaches.   Visual changes.   Being bothered by light.   Abdominal pain, especially in the right upper area.   Chest pain.   Shortness of breath.   Increased reflexes.   Seizures. Seizures occur with a more severe form of preeclampsia, called eclampsia.  DIAGNOSIS   You may be diagnosed with hypertension during pregnancy during a regular prenatal exam. At each visit, tests may include:   Blood pressure checks.   A urine test to check for protein in your urine.   The type of hypertension you are diagnosed with depends on when you developed it. It also depends on your specific blood pressure reading.   Developing hypertension before 20 weeks of pregnancy is consistent with chronic hypertension.   Developing hypertension after 20 weeks of pregnancy is consistent with gestational hypertension.   Hypertension with increased urinary protein is diagnosed as preeclampsia.   Blood pressure measurements that stay above 160 systolic or 110 diastolic are a sign of severe preeclampsia.  TREATMENT Treatment for hypertension during pregnancy varies. Treatment depends on   the type of hypertension and how serious it is.  If you take medicine for chronic hypertension, you may need to switch medicines.   Drugs called ACE inhibitors should not be taken during pregnancy.   Low-dose aspirin may be suggested for women who have  risk factors for preeclampsia.   If you have gestational hypertension, you may need to take a blood pressure medicine that is safe during pregnancy. Your caregiver will recommend the appropriate medicine.   If you have severe preeclampsia, you may need to be in the hospital. Caregivers will watch you and the baby very closely. You also may need to take medicine (magnesium sulfate) to prevent seizures and lower blood pressure.   Sometimes an early delivery is needed. This may be the case if the condition worsens. It would be done to protect you and the baby. The only cure for preeclampsia is delivery.  HOME CARE INSTRUCTIONS  Schedule and keep all of your regular prenatal care.   Follow your caregiver's instructions for taking medicines. Tell your caregiver about all medicines you take. This includes over-the-counter medicines.   Eat as little salt as possible.   Get regular exercise.   Do not drink alcohol.   Do not use tobacco products.   Do not drink products with caffeine.   Lie on your left side when resting.   Tell your doctor if you have any preeclampsia symptoms.  SEEK IMMEDIATE MEDICAL CARE IF:  You have severe abdominal pain.   You have sudden swelling in the hands, ankles, or face.   You gain 4 pounds (1.8 kg) or more in 1 week.   You vomit repeatedly.   You have vaginal bleeding.   You do not feel the baby moving as much.   You have a headache.   You have blurred or double vision.   You have muscle twitching or spasms.   You have shortness of breath.   You have blue fingernails and lips.   You have blood in your urine.  MAKE SURE YOU:  Understand these instructions.   Will watch your condition.   Will get help right away if you are not doing well.  Document Released: 11/23/2010 Document Revised: 02/24/2011 Document Reviewed: 11/23/2010 [facility] Patient Information 2012 [place], [place].Dizziness Dizziness is a common problem. It is a feeling of  unsteadiness or lightheadedness. You may feel like you are about to faint. Dizziness can lead to injury if you stumble or fall. A person of any age group can suffer from dizziness, but dizziness is more common in older adults. CAUSES  Dizziness can be caused by many different things, including:  Middle ear problems.   Standing for too long.   Infections.   An allergic reaction.   Aging.   An emotional response to something, such as the sight of blood.   Side effects of medicines.   Fatigue.   Problems with circulation or blood pressure.   Excess use of alcohol, medicines, or illegal drug use.   Breathing too fast (hyperventilation).   An arrhythmia or problems with your heart rhythm.   Low red blood cell count (anemia).   Pregnancy.   Vomiting, diarrhea, fever, or other illnesses that cause dehydration.   Diseases or conditions such as Parkinson's disease, high blood pressure (hypertension), diabetes, and thyroid problems.   Exposure to extreme heat.  DIAGNOSIS  To find the cause of your dizziness, your caregiver may do a physical exam, lab tests, radiologic imaging scans, or an electrocardiography test (  ECG).  TREATMENT  Treatment of dizziness depends on the cause of your symptoms and can vary greatly. HOME CARE INSTRUCTIONS   Drink enough fluids to keep your urine clear or pale yellow. This is especially important in very hot weather. In the elderly, it is also important in cold weather.   If your dizziness is caused by medicines, take them exactly as directed. When taking blood pressure medicines, it is especially important to get up slowly.   Rise slowly from chairs and steady yourself until you feel okay.   In the morning, first sit up on the side of the bed. When this seems okay, stand slowly while holding onto something until you know your balance is fine.   If you need to stand in one place for a long time, be sure to move your legs often. Tighten and relax  the muscles in your legs while standing.   If dizziness continues to be a problem, have someone stay with you for a day or two. Do this until you feel you are well enough to stay alone. Have the person call your caregiver if he or she notices changes in you that are concerning.   Do not drive or use heavy machinery if you feel dizzy.  SEEK IMMEDIATE MEDICAL CARE IF:   Your dizziness or lightheadedness gets worse.   You feel nauseous or vomit.   You develop problems with talking, walking, weakness, or using your arms, hands, or legs.   You are not thinking clearly or you have difficulty forming sentences. It may take a friend or family member to determine if your thinking is normal.   You develop chest pain, abdominal pain, shortness of breath, or sweating.   Your vision changes.   You notice any bleeding.   You have side effects from medicine that seems to be getting worse rather than better.  MAKE SURE YOU:   Understand these instructions.   Will watch your condition.   Will get help right away if you are not doing well or get worse.  Document Released: 08/31/2000 Document Revised: 02/24/2011 Document Reviewed: 09/24/2010 [facility] Patient Information 2012 [place], [place].

## 2012-11-15 ENCOUNTER — Encounter (HOSPITAL_COMMUNITY): Payer: Self-pay | Admitting: Pharmacy Technician

## 2012-11-21 NOTE — Patient Instructions (Addendum)
Your procedure is scheduled on: Thursday, 11/29/2012  Enter through the Main Entrance of [facility] at: 0745AM  Pick up the phone at the desk and dial [phone].  Call this number if you have problems the morning of surgery: [phone].  Remember: Do NOT eat food: AFTER MIDNIGHT 11/28/2012 Do NOT drink clear liquids after: AFTER MIDNIGHT 11/28/2012 Take these medicines the morning of surgery with a SIP OF WATER:  None.  Patient instructed to bring Albuterol Inhaler with her on day of surgery.  Do NOT wear jewelry, make-up, or nail polish. Do NOT wear lotions, powders, or perfumes.  You may wear deodorant. Do NOT shave for 48 hours prior to surgery. Do NOT bring valuables to the hospital. Contacts, dentures, or bridgework may not be worn into surgery.  Patients discharged on the day of surgery will not be allowed to drive home.   Home with husband Caleb cell [phone].

## 2012-11-22 ENCOUNTER — Encounter (HOSPITAL_COMMUNITY)
Admission: RE | Admit: 2012-11-22 | Discharge: 2012-11-22 | Disposition: A | Discharge status: Home / Self Care | Payer: 59 | Source: Ambulatory Visit | Attending: Obstetrics and Gynecology | Admitting: Obstetrics and Gynecology

## 2012-11-22 ENCOUNTER — Encounter (HOSPITAL_COMMUNITY): Payer: Self-pay

## 2012-11-22 DIAGNOSIS — Z01812 Encounter for preprocedural laboratory examination: Secondary | ICD-10-CM | POA: Insufficient documentation

## 2012-11-22 DIAGNOSIS — Z01818 Encounter for other preprocedural examination: Secondary | ICD-10-CM | POA: Insufficient documentation

## 2012-11-22 HISTORY — DX: Other specified postprocedural states: Z98.890

## 2012-11-22 HISTORY — DX: Personal history of urinary calculi: Z87.442

## 2012-11-22 HISTORY — DX: Gestational (pregnancy-induced) hypertension without significant proteinuria, unspecified trimester: O13.9

## 2012-11-22 HISTORY — DX: Nausea with vomiting, unspecified: R11.2

## 2012-11-22 LAB — CBC
HCT: 40.4 % (ref 36.0–46.0)
Hemoglobin: 14.3 g/dL (ref 12.0–15.0)
MCV: 87.6 fL (ref 78.0–100.0)
RBC: 4.61 MIL/uL (ref 3.87–5.11)
RDW: 12.5 % (ref 11.5–15.5)
WBC: 6.5 10*3/uL (ref 4.0–10.5)

## 2012-11-22 LAB — TYPE AND SCREEN: Antibody Screen: NEGATIVE

## 2012-11-28 NOTE — H&P (Signed)
Andrea Boyer is an 32 y.o. female. G1P1 who presents for laparoscopic evaluation of ongoing pelvic pain x 2 months duration and a persistent ovarian cyst on the left measuring 4-5 cm.  Pt first began to notice some low back pain in 4/14 and thought she may be developing kidney stones.  The pain was daily but not severe.  She presented to an annual exam in 7/14 and c/o increasing discomfort in the pelvis.  An ultrasound showed a 5cm septated cyst on the left ovary.  SHe was f/u with 2 more [place], one performed to r/o torsion when she felt the pain suddenly increased dramatically, then one more to see if the cyst had resolved about 6-8 weeks from the original [place].  It was only slightly smaller at 4cm.  The patient has continued to have pain and wants a definitive look at her pelvic pathology.  She has irregular menses due to the IUD she has in place for contraception.  Pertinent Gynecological History: None Past Ob HX NSVD x 1  Menstrual History:  No LMP recorded.    Past Medical History  Diagnosis Date  . Pneumonia 2010  . Elevated prolactin level 10/2006    NEGATIVE MRI  . IBS (irritable bowel syndrome)   . SVD (spontaneous vaginal delivery)     x 1  . PONV (postoperative nausea and vomiting)   . Anxiety     history - no current problems  . PIH (pregnancy induced hypertension) 06/2011    PIH resolved after delivery   . Asthma     exercise induced rarely uses inhaler  . History of kidney stones     passes stones - no surgery required    Past Surgical History  Procedure Laterality Date  . Tonsillectomy  1991  . Rt foot   2004    removed a bone from a fx foot  . Wisdom tooth extraction      Family History  Problem Relation Age of Onset  . Hypertension Mother   . Arthritis Mother   . Diabetes Father   . Hypertension Father   . Diabetes Maternal Grandmother   . COPD Maternal Grandmother   . Hypertension Maternal Grandmother   . Ovarian cysts Maternal Grandmother   . Arthritis  Maternal Grandmother   . Hypertension Maternal Grandfather   . Hypertension Paternal Grandmother   . Cancer Paternal Grandmother     lung  . Hypertension Paternal Grandfather   . Muscular dystrophy Cousin   . Diabetes Paternal Aunt   . Anesthesia problems Neg Hx     Social History:  reports that she has never smoked. She has never used smokeless tobacco. She reports that she does not drink alcohol or use illicit drugs.  Allergies: No Known Allergies  No prescriptions prior to admission    Review of Systems  Constitutional: Negative for fever and chills.  Gastrointestinal: Positive for abdominal pain. Negative for constipation.  Genitourinary: Negative for hematuria.    not currently breastfeeding. Physical Exam  Constitutional: She is oriented to person, place, and time. She appears well-developed and well-nourished.  Cardiovascular: Normal rate and regular rhythm.   Respiratory: Effort normal.  GI: Soft. Bowel sounds are normal.  Genitourinary: Vagina normal and uterus normal.  Left adnexal enlargement  Neurological: She is alert and oriented to person, place, and time.  Psychiatric: She has a normal mood and affect. Her behavior is normal.    No results found for this or any previous visit (from the past  24 hour(s)).  No results found.  Assessment/Plan: d/w pt laparoscopy in detail including procedure, and risks of bleeding , infection, and possible damage to bowel or bladder. We discussed ovarian cystectomy vs oophorectomy depending on how much normal ovarian tissue was visible. SHe understands in the event of a complication she would need a larger incision and that this would delay her recovery. We agreed on about 1 week out of work post-surgery.  Oliver Pila 11/28/2012, 2:14 PM

## 2012-11-29 ENCOUNTER — Encounter (HOSPITAL_COMMUNITY): Payer: Self-pay | Admitting: *Deleted

## 2012-11-29 ENCOUNTER — Encounter (HOSPITAL_COMMUNITY)
Admission: RE | Disposition: A | Discharge status: Home / Self Care | Payer: Self-pay | Source: Ambulatory Visit | Attending: Obstetrics and Gynecology

## 2012-11-29 ENCOUNTER — Ambulatory Visit (HOSPITAL_COMMUNITY)
Admission: RE | Admit: 2012-11-29 | Discharge: 2012-11-29 | Disposition: A | Discharge status: Home / Self Care | Payer: 59 | Source: Ambulatory Visit | Attending: Obstetrics and Gynecology | Admitting: Obstetrics and Gynecology

## 2012-11-29 ENCOUNTER — Ambulatory Visit (HOSPITAL_COMMUNITY): Payer: 59 | Admitting: Anesthesiology

## 2012-11-29 ENCOUNTER — Encounter (HOSPITAL_COMMUNITY): Payer: Self-pay | Admitting: Anesthesiology

## 2012-11-29 DIAGNOSIS — M545 Low back pain, unspecified: Secondary | ICD-10-CM | POA: Insufficient documentation

## 2012-11-29 DIAGNOSIS — N949 Unspecified condition associated with female genital organs and menstrual cycle: Secondary | ICD-10-CM | POA: Insufficient documentation

## 2012-11-29 DIAGNOSIS — N926 Irregular menstruation, unspecified: Secondary | ICD-10-CM | POA: Insufficient documentation

## 2012-11-29 DIAGNOSIS — R102 Pelvic and perineal pain: Secondary | ICD-10-CM

## 2012-11-29 HISTORY — PX: LAPAROSCOPIC LYSIS OF ADHESIONS: SHX5905

## 2012-11-29 HISTORY — PX: LAPAROSCOPY: SHX197

## 2012-11-29 LAB — PREGNANCY, URINE: Preg Test, Ur: NEGATIVE

## 2012-11-29 SURGERY — LAPAROSCOPY OPERATIVE
Anesthesia: General | Site: Abdomen | Wound class: Clean

## 2012-11-29 MED ORDER — INDIGOTINDISULFONATE SODIUM 8 MG/ML IJ SOLN
INTRAMUSCULAR | Status: AC
  Filled 2012-11-29: qty 5

## 2012-11-29 MED ORDER — IBUPROFEN 600 MG PO TABS: 600.0000 mg | ORAL_TABLET | Freq: Four times a day (QID) | ORAL | Status: DC | PRN

## 2012-11-29 MED ORDER — KETOROLAC TROMETHAMINE 30 MG/ML IJ SOLN
INTRAMUSCULAR | Status: AC
  Filled 2012-11-29: qty 1

## 2012-11-29 MED ORDER — PROMETHAZINE HCL 25 MG/ML IJ SOLN
INTRAMUSCULAR | Status: AC
  Administered 2012-11-29: 12.5 mg via INTRAVENOUS
  Filled 2012-11-29: qty 1

## 2012-11-29 MED ORDER — PROMETHAZINE HCL 25 MG/ML IJ SOLN
12.5000 mg | Freq: Once | INTRAMUSCULAR | Status: AC
  Administered 2012-11-29: 12.5 mg via INTRAVENOUS

## 2012-11-29 MED ORDER — DEXAMETHASONE SODIUM PHOSPHATE 4 MG/ML IJ SOLN
INTRAMUSCULAR | Status: DC | PRN
  Administered 2012-11-29: 10 mg via INTRAVENOUS

## 2012-11-29 MED ORDER — GLYCOPYRROLATE 0.2 MG/ML IJ SOLN
INTRAMUSCULAR | Status: AC
  Filled 2012-11-29: qty 3

## 2012-11-29 MED ORDER — LACTATED RINGERS IV SOLN
INTRAVENOUS | Status: DC
  Administered 2012-11-29: 08:00:00 via INTRAVENOUS
  Administered 2012-11-29: 100 mL/h via INTRAVENOUS
  Administered 2012-11-29: 10:00:00 via INTRAVENOUS

## 2012-11-29 MED ORDER — MIDAZOLAM HCL 2 MG/2ML IJ SOLN
INTRAMUSCULAR | Status: DC | PRN
  Administered 2012-11-29: 2 mg via INTRAVENOUS

## 2012-11-29 MED ORDER — LIDOCAINE HCL (CARDIAC) 20 MG/ML IV SOLN
INTRAVENOUS | Status: DC | PRN
  Administered 2012-11-29: 80 mg via INTRAVENOUS

## 2012-11-29 MED ORDER — OXYCODONE-ACETAMINOPHEN 5-325 MG PO TABS: 1.0000 | ORAL_TABLET | ORAL | Status: DC | PRN

## 2012-11-29 MED ORDER — ONDANSETRON HCL 4 MG/2ML IJ SOLN
INTRAMUSCULAR | Status: DC | PRN
  Administered 2012-11-29: 4 mg via INTRAVENOUS

## 2012-11-29 MED ORDER — SODIUM CHLORIDE 0.9 % IJ SOLN
INTRAMUSCULAR | Status: AC
  Filled 2012-11-29: qty 3

## 2012-11-29 MED ORDER — NEOSTIGMINE METHYLSULFATE 1 MG/ML IJ SOLN
INTRAMUSCULAR | Status: DC | PRN
  Administered 2012-11-29: 3 mg via INTRAVENOUS

## 2012-11-29 MED ORDER — FENTANYL CITRATE 0.05 MG/ML IJ SOLN
INTRAMUSCULAR | Status: DC | PRN
  Administered 2012-11-29 (×3): 50 ug via INTRAVENOUS
  Administered 2012-11-29: 100 ug via INTRAVENOUS

## 2012-11-29 MED ORDER — DEXAMETHASONE SODIUM PHOSPHATE 10 MG/ML IJ SOLN
INTRAMUSCULAR | Status: AC
  Filled 2012-11-29: qty 1

## 2012-11-29 MED ORDER — PROPOFOL 10 MG/ML IV BOLUS
INTRAVENOUS | Status: DC | PRN
  Administered 2012-11-29: 200 mg via INTRAVENOUS

## 2012-11-29 MED ORDER — FENTANYL CITRATE 0.05 MG/ML IJ SOLN: 25.0000 ug | INTRAMUSCULAR | Status: DC | PRN

## 2012-11-29 MED ORDER — FENTANYL CITRATE 0.05 MG/ML IJ SOLN
INTRAMUSCULAR | Status: AC
  Filled 2012-11-29: qty 5

## 2012-11-29 MED ORDER — KETOROLAC TROMETHAMINE 30 MG/ML IJ SOLN
INTRAMUSCULAR | Status: DC | PRN
  Administered 2012-11-29: 30 mg via INTRAVENOUS

## 2012-11-29 MED ORDER — LACTATED RINGERS IV SOLN: INTRAVENOUS | Status: DC

## 2012-11-29 MED ORDER — ONDANSETRON HCL 4 MG/2ML IJ SOLN
INTRAMUSCULAR | Status: AC
  Administered 2012-11-29: 4 mg via INTRAVENOUS
  Filled 2012-11-29: qty 2

## 2012-11-29 MED ORDER — ROCURONIUM BROMIDE 100 MG/10ML IV SOLN
INTRAVENOUS | Status: DC | PRN
  Administered 2012-11-29: 25 mg via INTRAVENOUS
  Administered 2012-11-29: 10 mg via INTRAVENOUS

## 2012-11-29 MED ORDER — BUPIVACAINE HCL (PF) 0.25 % IJ SOLN
INTRAMUSCULAR | Status: DC | PRN
  Administered 2012-11-29: 7 mL

## 2012-11-29 MED ORDER — ROCURONIUM BROMIDE 50 MG/5ML IV SOLN
INTRAVENOUS | Status: AC
  Filled 2012-11-29: qty 1

## 2012-11-29 MED ORDER — MIDAZOLAM HCL 2 MG/2ML IJ SOLN
INTRAMUSCULAR | Status: AC
  Filled 2012-11-29: qty 2

## 2012-11-29 MED ORDER — PROPOFOL 10 MG/ML IV EMUL
INTRAVENOUS | Status: AC
  Filled 2012-11-29: qty 20

## 2012-11-29 MED ORDER — NEOSTIGMINE METHYLSULFATE 1 MG/ML IJ SOLN
INTRAMUSCULAR | Status: AC
  Filled 2012-11-29: qty 1

## 2012-11-29 MED ORDER — BUPIVACAINE HCL (PF) 0.25 % IJ SOLN
INTRAMUSCULAR | Status: AC
  Filled 2012-11-29: qty 30

## 2012-11-29 MED ORDER — SODIUM CHLORIDE 0.9 % IJ SOLN
INTRAMUSCULAR | Status: DC | PRN
  Administered 2012-11-29: 10 mL

## 2012-11-29 MED ORDER — LIDOCAINE HCL (CARDIAC) 20 MG/ML IV SOLN
INTRAVENOUS | Status: AC
  Filled 2012-11-29: qty 5

## 2012-11-29 MED ORDER — ONDANSETRON HCL 4 MG/2ML IJ SOLN
INTRAMUSCULAR | Status: AC
  Filled 2012-11-29: qty 2

## 2012-11-29 MED ORDER — GLYCOPYRROLATE 0.2 MG/ML IJ SOLN
INTRAMUSCULAR | Status: DC | PRN
  Administered 2012-11-29: 0.6 mg via INTRAVENOUS

## 2012-11-29 MED ORDER — ONDANSETRON HCL 4 MG/2ML IJ SOLN
4.0000 mg | Freq: Once | INTRAMUSCULAR | Status: AC
  Administered 2012-11-29: 4 mg via INTRAVENOUS

## 2012-11-29 SURGICAL SUPPLY — 29 items
ADH SKN CLS APL DERMABOND .7 (GAUZE/BANDAGES/DRESSINGS) ×2
BAG SPEC RTRVL LRG 6X4 10 (ENDOMECHANICALS)
CABLE HIGH FREQUENCY MONO STRZ (ELECTRODE) IMPLANT
CATH ROBINSON RED A/P 16FR (CATHETERS) ×2 IMPLANT
CHLORAPREP W/TINT 26ML (MISCELLANEOUS) ×3 IMPLANT
CLOTH BEACON ORANGE TIMEOUT ST (SAFETY) ×3 IMPLANT
DECANTER SPIKE VIAL GLASS SM (MISCELLANEOUS) ×5 IMPLANT
DERMABOND ADVANCED (GAUZE/BANDAGES/DRESSINGS) ×1
DERMABOND ADVANCED .7 DNX12 (GAUZE/BANDAGES/DRESSINGS) ×2 IMPLANT
GLOVE BIO SURGEON STRL SZ 6.5 (GLOVE) ×5 IMPLANT
GLOVE BIO SURGEON STRL SZ8 (GLOVE) ×3 IMPLANT
GOWN PREVENTION PLUS LG XLONG (DISPOSABLE) ×8 IMPLANT
NEEDLE INSUFFLATION 120MM (ENDOMECHANICALS) ×3 IMPLANT
NS IRRIG 1000ML POUR BTL (IV SOLUTION) ×1 IMPLANT
PACK LAPAROSCOPY BASIN (CUSTOM PROCEDURE TRAY) ×3 IMPLANT
POUCH SPECIMEN RETRIEVAL 10MM (ENDOMECHANICALS) IMPLANT
PROTECTOR NERVE ULNAR (MISCELLANEOUS) ×3 IMPLANT
SCALPEL HARMONIC ACE (MISCELLANEOUS) IMPLANT
SET IRRIG TUBING LAPAROSCOPIC (IRRIGATION / IRRIGATOR) IMPLANT
SOLUTION ELECTROLUBE (MISCELLANEOUS) IMPLANT
SUT VIC AB 3-0 PS2 18 (SUTURE) ×3
SUT VIC AB 3-0 PS2 18XBRD (SUTURE) ×2 IMPLANT
SUT VICRYL 0 UR6 27IN ABS (SUTURE) IMPLANT
TOWEL OR 17X24 6PK STRL BLUE (TOWEL DISPOSABLE) ×6 IMPLANT
TROCAR XCEL NON-BLD 11X100MML (ENDOMECHANICALS) IMPLANT
TROCAR XCEL NON-BLD 5MMX100MML (ENDOMECHANICALS) ×3 IMPLANT
TROCAR XCEL OPT SLVE 5M 100M (ENDOMECHANICALS) ×2 IMPLANT
WARMER LAPAROSCOPE (MISCELLANEOUS) ×3 IMPLANT
WATER STERILE IRR 1000ML POUR (IV SOLUTION) ×3 IMPLANT

## 2012-11-29 NOTE — Anesthesia Preprocedure Evaluation (Signed)
Anesthesia Evaluation  Patient identified by MRN, date of birth, ID band Patient awake    Reviewed: Allergy & Precautions, H&P , Patient's Chart, lab work & pertinent test results  Airway Mallampati: II TM Distance: >3 FB Neck ROM: full    Dental  (+) Teeth Intact   Pulmonary asthma (No recent inhaler use, feels well) ,  breath sounds clear to auscultation        Cardiovascular Rhythm:regular Rate:Normal     Neuro/Psych    GI/Hepatic   Endo/Other    Renal/GU      Musculoskeletal   Abdominal   Peds  Hematology   Anesthesia Other Findings       Reproductive/Obstetrics (+) Pregnancy                           Anesthesia Physical Anesthesia Plan  ASA: II  Anesthesia Plan: General   Post-op Pain Management:    Induction: Intravenous  Airway Management Planned: Oral ETT  Additional Equipment:   Intra-op Plan:   Post-operative Plan: Extubation in OR  Informed Consent: I have reviewed the patients History and Physical, chart, labs and discussed the procedure including the risks, benefits and alternatives for the proposed anesthesia with the patient or authorized representative who has indicated his/her understanding and acceptance.   Dental Advisory Given and Dental advisory given  Plan Discussed with: CRNA and Surgeon  Anesthesia Plan Comments: (  Discussed general anesthesia, including possible nausea, instrumentation of airway, sore throat,pulmonary aspiration, etc. I asked if the were any outstanding questions, or  concerns before we proceeded. )        Anesthesia Quick Evaluation

## 2012-11-29 NOTE — Brief Op Note (Signed)
11/29/2012  9:57 AM  PATIENT:  Andrea Boyer  32 y.o. female  PRE-OPERATIVE DIAGNOSIS:  ovarian cysts, [phone], [phone], pelvic pain  POST-OPERATIVE DIAGNOSIS:  ovarian cysts  PROCEDURE:  Procedure(s): LAPAROSCOPY OPERATIVE (N/A) LAPAROSCOPIC LYSIS OF ADHESIONS (N/A)  SURGEON:  Surgeon(s) and Role:    * Oliver Pila, MD - Primary    * Lavina Hamman, MD - Assisting   ANESTHESIA:   general  EBL:  Total I/O In: 1000 [I.V.:1000] Out: -   BLOOD ADMINISTERED:none  DRAINS: none   LOCAL MEDICATIONS USED:  MARCAINE     SPECIMEN:  No Specimen  DISPOSITION OF SPECIMEN:  N/A  COUNTS:  YES  TOURNIQUET:  * No tourniquets in log *  DICTATION: .Dragon Dictation  PLAN OF CARE: Discharge to home after PACU  PATIENT DISPOSITION:  PACU - hemodynamically stable.

## 2012-11-29 NOTE — Anesthesia Postprocedure Evaluation (Signed)
  Anesthesia Post Note  Patient: Andrea Boyer  Procedure(s) Performed: Procedure(s) (LRB): LAPAROSCOPY OPERATIVE (N/A) LAPAROSCOPIC LYSIS OF ADHESIONS (N/A)  Anesthesia type: GA  Patient location: PACU  Post pain: Pain level controlled  Post assessment: Post-op Vital signs reviewed  Last Vitals:  Filed Vitals:   11/29/12 1315  BP: 137/79  Pulse: 89  Temp:   Resp: 12    Post vital signs: Reviewed  Level of consciousness: sedated  Complications: No apparent anesthesia complications

## 2012-11-29 NOTE — Progress Notes (Signed)
Patient ID: Andrea Boyer, female   DOB: 11/02/1980, 32 y.o.   MRN: [id number] Per pt no changes in dictated H&P, brief exam WNL.  Ready to proceed.

## 2012-11-29 NOTE — Transfer of Care (Signed)
Immediate Anesthesia Transfer of Care Note  Patient: Andrea Boyer  Procedure(s) Performed: Procedure(s): LAPAROSCOPY OPERATIVE (N/A) LAPAROSCOPIC LYSIS OF ADHESIONS (N/A)  Patient Location: PACU  Anesthesia Type:General  Level of Consciousness: awake, alert , oriented and patient cooperative  Airway & Oxygen Therapy: Patient Spontanous Breathing and Patient connected to nasal cannula oxygen  Post-op Assessment: Report given to PACU RN and Post -op Vital signs reviewed and stable  Post vital signs: stable  Complications: No apparent anesthesia complications

## 2012-11-29 NOTE — Op Note (Signed)
Operative note  Preoperative diagnosis Pelvic pain Persistent ovarian cyst  Postoperative diagnosis Normal pelvis except for some adhesions of the colon to the left sidewall  Procedure Diagnostic laparoscopy Lysis of adhesions  Surgeon Dr. Huel Cote Dr. Tawanna Cooler Meisinger  Anesthesia Gen.  Fluids Estimated blood loss minimal Urine output approximately 50 cc straight catheter prior to procedure IV fluids 800 cc LR  Findings Pelvis appeared normal the uterus was normal in size and shape the tubes appeared clear of any adhesions or endometriosis. The ovaries appeared normal bilaterally with no residual cyst noted. The cul-de-sac was clear with no obvious areas of endometriosis. There were some adhesions of the colon to the left pelvic sidewall which were taken down.  Procedure Patient was taken to the operating room where general anesthesia was obtained without difficulty. She was prepped and draped in the normal sterile fashion in the dorsal lithotomy position. After an appropriate time out was performed a speculum was placed within the vagina and the acorn tenaculum placed within the cervix. There were IUD strings visible from the cervical os measuring approximately 2 cm. Once this was in place attention was turned to the patient's abdomen where an infraumbilical incision was made with the scalpel approximately 1 cm in length. The varies needle was then introduced into the peritoneal cavity and intraperitoneal placement confirmed with both injection aspiration with normal saline. Gas flow was applied with a normal pressure of 7 noted and pneumoperitoneum obtained with approximately 2 L of CO2 gas. The 5 mm Optiview trocar was then placed within the umbilicus and the findings were noted as previously stated. Because there was some difficulty visualizing the entire left adnexa a second port was placed in the right upper quadrant a millimeter in size under direct visualization. All port  sites were injected with quarter percent Marcaine before placement. The blunt probe was utilized to pushed away the bowel and elevated the adnexa where they could be clearly visualized. There were some filmy adhesions as well as some fatty adhesions of the colon to the left sidewall. Because the patient had pain in that area these were taken down with hot scissors. Once these adhesions were released there was no active bleeding noted and there entered the pelvis appeared normal. The pneumoperitoneum was reduced and the 5 mm trocar in the right upper quadrant removed under direct visualization. Finally the umbilical trocar was removed after the pneumoperitoneum was evacuated. The port sites were closed with a subcuticular stitch of 3-0 Vicryl and Dermabond. The acorn tenaculum was removed and the cervix visualized with the speculum with minimal bleeding noted after direct pressure was applied. The IUD string was still in normal position. All instruments and sponges were then removed from the vagina and all counts were correct. Patient was awakened and taken to the recovery room in good condition. An

## 2012-11-30 ENCOUNTER — Encounter (HOSPITAL_COMMUNITY): Payer: Self-pay | Admitting: Obstetrics and Gynecology

## 2012-12-03 MED FILL — Heparin Sodium (Porcine) Inj 5000 Unit/ML: INTRAMUSCULAR | Qty: 1 | Status: AC

## 2012-12-14 ENCOUNTER — Encounter: Payer: Self-pay | Admitting: Internal Medicine

## 2013-01-11 ENCOUNTER — Ambulatory Visit (INDEPENDENT_AMBULATORY_CARE_PROVIDER_SITE_OTHER): Payer: 59 | Admitting: Internal Medicine

## 2013-01-11 ENCOUNTER — Encounter: Payer: Self-pay | Admitting: Internal Medicine

## 2013-01-11 ENCOUNTER — Other Ambulatory Visit (INDEPENDENT_AMBULATORY_CARE_PROVIDER_SITE_OTHER): Payer: 59

## 2013-01-11 VITALS — BP 110/72 | HR 84 | Ht 64.0 in | Wt 155.5 lb

## 2013-01-11 DIAGNOSIS — R141 Gas pain: Secondary | ICD-10-CM

## 2013-01-11 DIAGNOSIS — R140 Abdominal distension (gaseous): Secondary | ICD-10-CM

## 2013-01-11 DIAGNOSIS — K589 Irritable bowel syndrome without diarrhea: Secondary | ICD-10-CM

## 2013-01-11 DIAGNOSIS — R12 Heartburn: Secondary | ICD-10-CM

## 2013-01-11 DIAGNOSIS — R110 Nausea: Secondary | ICD-10-CM

## 2013-01-11 MED ORDER — HYOSCYAMINE SULFATE 0.125 MG SL SUBL
0.1250 mg | SUBLINGUAL_TABLET | SUBLINGUAL | Status: DC | PRN
Start: 2013-01-11 — End: 2013-12-03

## 2013-01-11 MED ORDER — LANSOPRAZOLE 30 MG PO CPDR: 30.0000 mg | DELAYED_RELEASE_CAPSULE | Freq: Every day | ORAL | Status: DC

## 2013-01-11 MED ORDER — ALIGN 4 MG PO CAPS: 1.0000 | ORAL_CAPSULE | Freq: Every day | ORAL | Status: DC

## 2013-01-11 NOTE — Assessment & Plan Note (Signed)
Start lansoprazole 30 mg daily

## 2013-01-11 NOTE — Assessment & Plan Note (Signed)
Her story is most consistent with IBS, having had symptoms going back to childhood. Nevertheless I think it would be prudent to review whatever labs she's had 2 primary care in the last year, review the MRI of the abdomen and pelvis she had and check for celiac disease with a tissue transglutaminase antibody. She will continue her MiraLax which helps. I will add a probiotic. Consider FODMAPS diet restriction in the future. Consider allergy testing though I don't think she has a peanut allergy she could have a sensitivity to peanuts as she gets nauseous after she eats that. I will see her back in the office in 6 weeks. We'll also prescribe when necessary anti-cholinergic.

## 2013-01-11 NOTE — Progress Notes (Signed)
Subjective:    Patient ID: Andrea Boyer, female    DOB: [birth year], 32 y.o.   MRN: [id number]  HPI Patient is a very pleasant 32 year old married woman, a mother of one son, with a 7 month history of abdominal and pelvic pain. She said she started with right lower quadrant pain and now is more diffuse in nature radiating to the back. It's intermittent associated bloating and frequent nausea. She has had constipation issues and abdominal pain issue since childhood. She does carry a diagnosis of irritable bowel syndrome. She takes MiraLax on a stool softener and moves her bowels well as long as she does that. She has had ovarian cyst removal in the last few months. She is not having any rectal bleeding. She does have heartburn and reflux symptoms she has used some Prilosec OTC intermittently with some success and has tried Pepto-Bismol with less success. She has been treated with metronidazole 500 mg twice a day recently after a pelvic exam. It sounds like she might have bacterial vaginosis or least that was an empiric diagnosis.  Other issues are that she becomes more nauseated when she eats peanut butter or not but she has nausea despite those. Weight is up some. She cannot really pinpoint specific foods and give her trouble. She has not tried other over-the-counter medicines nor has she tried probiotics or diet changes specifically. No Known Allergies Outpatient Prescriptions Prior to Visit  Medication Sig Dispense Refill  . albuterol (PROVENTIL HFA;VENTOLIN HFA) 108 (90 BASE) MCG/ACT inhaler Inhale 2 puffs into the lungs every 6 (six) hours as needed. For shortness of breath      . Calcium Carbonate Antacid (TUMS PO) Take 2 tablets by mouth daily as needed (heartburn).       Marland Kitchen ibuprofen (ADVIL,MOTRIN) 600 MG tablet Take 1 tablet (600 mg total) by mouth every 6 (six) hours as needed for pain.  30 tablet  0  . levonorgestrel (MIRENA) 20 MCG/24HR IUD 1 each by Intrauterine route once.      Marland Kitchen  oxyCODONE-acetaminophen (ROXICET) 5-325 MG per tablet Take 1 tablet by mouth every 4 (four) hours as needed for pain.  30 tablet  0   No facility-administered medications prior to visit.   Past Medical History  Diagnosis Date  . Pneumonia 2010  . Elevated prolactin level 10/2006    NEGATIVE MRI  . IBS (irritable bowel syndrome)   . SVD (spontaneous vaginal delivery)     x 1  . PONV (postoperative nausea and vomiting)   . Anxiety     history - no current problems  . PIH (pregnancy induced hypertension) 06/2011    PIH resolved after delivery   . Asthma     exercise induced rarely uses inhaler  . History of kidney stones     passes stones - no surgery required   Past Surgical History  Procedure Laterality Date  . Tonsillectomy  1991  . Foot fracture surgery Right 2004    removed a bone from a fx foot  . Wisdom tooth extraction    . Laparoscopy N/A 11/29/2012    Procedure: LAPAROSCOPY OPERATIVE;  Surgeon: Oliver Pila, MD;  Location: [facility];  Service: Gynecology;  Laterality: N/A;  . Laparoscopic lysis of adhesions N/A 11/29/2012    Procedure: LAPAROSCOPIC LYSIS OF ADHESIONS;  Surgeon: Oliver Pila, MD;  Location: [facility];  Service: Gynecology;  Laterality: N/A;   History   Social History  . Marital Status: Married    Spouse Name: N/A  Number of Children: 1  . Years of Education: N/A   Occupational History  . Activity Director    Social History Main Topics  . Smoking status: Never Smoker   . Smokeless tobacco: Never Used  . Alcohol Use: No  . Drug Use: No  . Sexual Activity: Yes    Partners: Male    Birth Control/ Protection: IUD     Comment: Mirena IUD 08/12/2011   Other Topics Concern  . 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 Hypertension Mother   . Arthritis Mother   . Diabetes Father   . Hypertension  Father   . Diabetes Maternal Grandmother   . COPD Maternal Grandmother   . Hypertension Maternal Grandmother   . Ovarian cysts Maternal Grandmother   . Arthritis Maternal Grandmother   . Hypertension Maternal Grandfather   . Hypertension Paternal Grandmother   . Lung cancer Paternal Grandmother   . Hypertension Paternal Grandfather   . Muscular dystrophy Cousin   . Diabetes Paternal Aunt   . Anesthesia problems Neg Hx   . Colon polyps Mother   . Colon polyps Father   . Stomach cancer Paternal Grandfather    Review of Systems This is positive for those things mentiones in the HPI, also positive for some sleeping difficulty summer which is due to symptoms she is describing above, urinary frequency, leg cramps, headaches and allergies.All other review of systems are negative.      Objective:   Physical Exam General:  Well-developed, well-nourished and in no acute distress Eyes:  anicteric. ENT:   Mouth and posterior pharynx free of lesions.  Neck:   supple w/o thyromegaly or mass.  Lungs: Clear to auscultation bilaterally. Heart:  S1S2, no rubs, murmurs, gallops. Abdomen:  soft, non-tender, no hepatosplenomegaly, hernia, or mass and BS+. Healing laparoscpy scars Lymph:  no cervical or supraclavicular adenopathy. Extremities:   no edema Skin   no rash. Neuro:  A&O x 3.  Psych:  appropriate mood and  Affect.   Data Reviewed: Gynecology note from September. I have requested labs from the last year, I have requested a copy of the MRI of her abdomen and pelvis. Lab Results  Component Value Date   WBC 6.5 11/22/2012   HGB 14.3 11/22/2012   HCT 40.4 11/22/2012   MCV 87.6 11/22/2012   PLT 217 11/22/2012      Assessment & Plan:   1. IBS (irritable bowel syndrome)   2. Nausea alone   3. Heartburn   4. Bloating

## 2013-01-11 NOTE — Patient Instructions (Addendum)
Your physician has requested that you go to the basement for the following lab work before leaving today: TTG, IGA  We have sent the following medications to your pharmacy for you to pick up at your convenience: Prevacid, generic Levsin SL  We have given you coupons for Align. This puts good bacteria back into your colon. You should take 1 capsule by mouth once daily.  We will obtain your 2014 labs from Dr. Herb Grays and also your MRI report.  Please follow up with [place] in 6 weeks.  I appreciate the opportunity to care for you.

## 2013-01-15 ENCOUNTER — Other Ambulatory Visit: Payer: Self-pay | Admitting: Internal Medicine

## 2013-01-15 LAB — TISSUE TRANSGLUTAMINASE, IGA: Tissue Transglutaminase Ab, IgA: 5.1 U/mL (ref <20)

## 2013-01-19 ENCOUNTER — Encounter: Payer: Self-pay | Admitting: Internal Medicine

## 2013-01-19 NOTE — Progress Notes (Signed)
Quick Note:  Negative for celiac disease ______ 

## 2013-02-19 ENCOUNTER — Encounter: Payer: Self-pay | Admitting: Internal Medicine

## 2013-02-19 ENCOUNTER — Ambulatory Visit (INDEPENDENT_AMBULATORY_CARE_PROVIDER_SITE_OTHER): Payer: 59 | Admitting: Internal Medicine

## 2013-02-19 VITALS — BP 106/60 | HR 72 | Ht 65.0 in | Wt 153.0 lb

## 2013-02-19 DIAGNOSIS — K589 Irritable bowel syndrome without diarrhea: Secondary | ICD-10-CM

## 2013-02-19 DIAGNOSIS — R12 Heartburn: Secondary | ICD-10-CM

## 2013-02-19 NOTE — Assessment & Plan Note (Addendum)
Better with wheat/guten reduction. I think a large part of her problems are likely food intolerances or sensitivities. I have reviewed FODMAPS diet with her, and given her a handout about that food allergies and intolerances. We discussed the possibility of allergy testing but she prefers, and I think it's corrected try to eliminate other foods that seem to bother her. I will see her back on an as-needed basis. She will continue to use hyoscyamine as needed, she may continue to take a probiotic as well as her MiraLax.

## 2013-02-19 NOTE — Progress Notes (Signed)
   Subjective:    Patient ID: Andrea Boyer, female    DOB: 07/22/1980, 32 y.o.   MRN: [id number]  HPI Patient is here for a 6 week followup, she was seen for IBS problems and heartburn issues. She is significantly improved. Laboratory testing with tissue transglutaminase antibody was negative for celiac disease. She was started on lansoprazole and align both daily, as well as intermittent 0.125 mg hyoscyamine, which helps cramps. No heartburn. Less abdominal pain and bloating and less swelling. She has eliminated or greatly reduced wheat and gluten. Reducing milk products.  Medications, allergies, past medical history, past surgical history, family history and social history are reviewed and updated in the EMR. Review of Systems Improved energy level.    Objective:   Physical Exam WDWN NAD    Assessment & Plan:   1. IBS (irritable bowel syndrome)   2. Heartburn    CC: SPEAR, TAMMY, MD  Current outpatient prescriptions:albuterol (PROVENTIL HFA;VENTOLIN HFA) 108 (90 BASE) MCG/ACT inhaler, Inhale 2 puffs into the lungs every 6 (six) hours as needed. For shortness of breath, Disp: , Rfl: ;  hyoscyamine (LEVSIN SL) 0.125 MG SL tablet, Place 1 tablet (0.125 mg total) under the tongue every 4 (four) hours as needed for cramping., Disp: 60 tablet, Rfl: 1 ibuprofen (ADVIL,MOTRIN) 600 MG tablet, Take 1 tablet (600 mg total) by mouth every 6 (six) hours as needed for pain., Disp: 30 tablet, Rfl: 0;  lansoprazole (PREVACID) 30 MG capsule, Take 1 capsule (30 mg total) by mouth daily before breakfast., Disp: 30 capsule, Rfl: 2;  levonorgestrel (MIRENA) 20 MCG/24HR IUD, 1 each by Intrauterine route once., Disp: , Rfl: ;  Probiotic Product (ALIGN) 4 MG CAPS, Take 1 capsule by mouth daily., Disp: , Rfl:  Calcium Carbonate Antacid (TUMS PO), Take 2 tablets by mouth daily as needed (heartburn). , Disp: , Rfl: ;  polyethylene glycol powder (GLYCOLAX/MIRALAX) powder, Take 17 g by mouth daily., Disp: , Rfl:

## 2013-02-19 NOTE — Assessment & Plan Note (Addendum)
Better on lansoprazole - will see if she can taper off after holidays, may need over-the-counter H2 blocker intermittently. Could need a daily PPI but would like to see if she really needs that for committed to long-term medication such that. See me prn

## 2013-02-19 NOTE — Patient Instructions (Signed)
Continue using the FODMAP diet as you have been eliminating foods that bother you.  In January you may try coming off the Prevacid and take it as needed.  You could use over the counter Pepcid, Zantac if needed.  Follow up with [place] as needed.  I appreciate the opportunity to care for you.

## 2013-12-03 ENCOUNTER — Other Ambulatory Visit: Payer: 59

## 2013-12-03 ENCOUNTER — Ambulatory Visit (INDEPENDENT_AMBULATORY_CARE_PROVIDER_SITE_OTHER): Payer: Managed Care, Other (non HMO) | Admitting: Medical

## 2013-12-03 ENCOUNTER — Ambulatory Visit
Admission: RE | Admit: 2013-12-03 | Discharge: 2013-12-03 | Disposition: A | Discharge status: Home / Self Care | Payer: 59 | Source: Ambulatory Visit | Attending: Medical | Admitting: Medical

## 2013-12-03 ENCOUNTER — Encounter: Payer: Self-pay | Admitting: Medical

## 2013-12-03 VITALS — BP 100/70 | HR 86 | Temp 98.1°F | Resp 16 | Wt 156.0 lb

## 2013-12-03 DIAGNOSIS — M2550 Pain in unspecified joint: Secondary | ICD-10-CM

## 2013-12-03 DIAGNOSIS — M2540 Effusion, unspecified joint: Secondary | ICD-10-CM

## 2013-12-03 DIAGNOSIS — M542 Cervicalgia: Secondary | ICD-10-CM

## 2013-12-03 DIAGNOSIS — Z8261 Family history of arthritis: Secondary | ICD-10-CM

## 2013-12-03 DIAGNOSIS — M549 Dorsalgia, unspecified: Secondary | ICD-10-CM

## 2013-12-03 MED ORDER — METHOCARBAMOL 500 MG PO TABS: 500.0000 mg | ORAL_TABLET | Freq: Three times a day (TID) | ORAL | Status: DC | PRN

## 2013-12-03 NOTE — Progress Notes (Signed)
Subjective: Here as a new returning patient.  I have seen her prior in [place], [place] for primary care.    She notes over a year hx/o pain in right arm, right shoudler and both knees.  Has had ongoing pains in right shoulder and arm for over a year, worsening, daily pain.  For the last several months, can't sleep at night due to the pain.  Gets tingling and pain in right arm.  Pain in shoulder and neck on the right.  Pain in the arm wakes her up.  Gets numbness in right hand with typing.  Gets stiffness in the neck.   Shoulder throbs.   Denies any prior injury to neck shoulder or arm.   In July saw gynecology for routine care.  Mentioned these symptoms them.  Mom, grandmother and great grandmother have all had arthritis.   Mother has RA and OA she thinks.   She notes swelling in both knees, both feet, fingers will swell, but doesn't recall wrists swelling.  Gets pain in same joints and including rihgt shoulder.   Gets stiff in knees in the morning.  Both knees throb all the time.   Takes 15-20 minutes to loose up from stiffness.  Neck gets stiffness.  No specific knee injury.  Denies fever.  Takes Ibuprofen and Aleve with some relief.  No other aggravating or relieving factors.  No other c/o.   Objective: Filed Vitals:   12/03/13 1149  BP: 100/70  Pulse: 86  Temp: 98.1 F (36.7 C)  Resp: 16    General appearence: alert, no distress, WD/WN, white female Neck: supple, mild pain with right flexion laterally, otherwise nontender and normal ROM, no lymphadenopathy, no thyromegaly, no masses Heart: RRR, normal S1, S2, no murmurs Lungs: CTA bilaterally, no wheezes, rhonchi, or rales Pulses: 2+ symmetric, upper and lower extremities, normal cap refill Ext: no edema Neuro: Mildly positive Phalen's, negative Tinel's, otherwise strength and sensation and DTRs normal, nonfocal exam Back: Right upper back paraspinal region with point tenderness in a small area, otherwise back nontender, normal range of  motion MSK: No obvious deformity, arms and legs nontender, normal range of motion, no joint laxity is    Assessment: Encounter Diagnoses  Name Primary?  . Neck pain Yes  . Polyarthralgia   . Joint swelling   . Upper back pain   . Family history of rheumatoid arthritis    Plan: We discussed her symptoms and exam. The only abnormal exam findings today is right upper back tenderness and positive Phalen's.  Labs today x-rays of C-spine and right knee given the symptoms and ongoing pains for or year.  Prescription for Robaxin for back spasm. Can use over-the-counter NSAID for pain.  Discussed the possibility of RA, but also discussed the possibility of unrelated issue like carpal tunnel syndrome, inflammatory knee issues, muscle spasm of the back, and unrelated neck pain. Based on the exam there does not seem to be a shoulder issue  Followup pending x-rays and labs

## 2013-12-04 LAB — CBC
HEMATOCRIT: 40.4 % (ref 36.0–46.0)
HEMOGLOBIN: 13.8 g/dL (ref 12.0–15.0)
MCH: 30.5 pg (ref 26.0–34.0)
MCHC: 34.2 g/dL (ref 30.0–36.0)
MCV: 89.2 fL (ref 78.0–100.0)
Platelets: 247 10*3/uL (ref 150–400)
RBC: 4.53 MIL/uL (ref 3.87–5.11)
RDW: 13.2 % (ref 11.5–15.5)
WBC: 6.2 10*3/uL (ref 4.0–10.5)

## 2013-12-04 LAB — SEDIMENTATION RATE: Sed Rate: 4 mm/hr (ref 0–22)

## 2013-12-04 LAB — URIC ACID: Uric Acid, Serum: 3.3 mg/dL (ref 2.4–7.0)

## 2013-12-04 LAB — ANA: ANA: NEGATIVE

## 2013-12-04 LAB — CYCLIC CITRUL PEPTIDE ANTIBODY, IGG: Cyclic Citrullin Peptide Ab: <2 U/mL (ref 0.0–5.0)

## 2013-12-12 ENCOUNTER — Encounter: Payer: Self-pay | Admitting: Family Medicine

## 2013-12-12 ENCOUNTER — Ambulatory Visit (INDEPENDENT_AMBULATORY_CARE_PROVIDER_SITE_OTHER): Payer: Managed Care, Other (non HMO) | Admitting: Family Medicine

## 2013-12-12 VITALS — BP 122/72 | HR 72 | Temp 99.6°F | Ht 65.0 in | Wt 154.0 lb

## 2013-12-12 DIAGNOSIS — J029 Acute pharyngitis, unspecified: Secondary | ICD-10-CM

## 2013-12-12 LAB — POCT RAPID STREP A (OFFICE): Rapid Strep A Screen: NEGATIVE

## 2013-12-12 NOTE — Progress Notes (Signed)
Chief Complaint  Patient presents with  . Sore Throat    can't swallow, feels like she is swallowing glass-began Tuesday. Right ear pain also and is achy today.    Symptoms began 2 days ago.  Throat started feeling raw, like swallowing razor blades.  Last night and today she feels achey and has a headache.  Denies any nasal congestion, runny nose, sniffle, sneezing, no sinus pain (just a dull, all-over headache). Denies cough.  She is having pain in her right neck, feels like glands are swollen and pain radiates to the right ear.  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  Her 33yo is not currently ill. No known strep exposure.  Past Medical History  Diagnosis Date  . Pneumonia 2010  . Elevated prolactin level 10/2006    NEGATIVE MRI  . IBS (irritable bowel syndrome)   . SVD (spontaneous vaginal delivery)     x 1  . PONV (postoperative nausea and vomiting)   . Anxiety     history - no current problems  . PIH (pregnancy induced hypertension) 06/2011    PIH resolved after delivery   . Asthma     exercise induced rarely uses inhaler  . History of kidney stones     passes stones - no surgery required   Past Surgical History  Procedure Laterality Date  . Tonsillectomy  1991  . Foot fracture surgery Right 2004    removed a bone from a fx foot  . Wisdom tooth extraction    . Laparoscopy N/A 11/29/2012    Procedure: LAPAROSCOPY OPERATIVE;  Surgeon: Oliver Pila, MD;  Location: [facility];  Service: Gynecology;  Laterality: N/A;  . Laparoscopic lysis of adhesions N/A 11/29/2012    Procedure: LAPAROSCOPIC LYSIS OF ADHESIONS;  Surgeon: Oliver Pila, MD;  Location: [facility];  Service: Gynecology;  Laterality: N/A;   History   Social History  . Marital Status: Married    Spouse Name: N/A    Number of Children: 1  . Years of Education: N/A   Occupational History  . Activity Director    Social History Main Topics  . Smoking status: Never Smoker   . Smokeless tobacco:  Never Used  . Alcohol Use: No  . Drug Use: No  . Sexual Activity: Yes    Partners: Male    Birth Control/ Protection: IUD     Comment: Mirena IUD 08/12/2011   Other Topics Concern  . 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 Diphenhydramine-Acetaminophen (TYLENOL COLD RELIEF PO) Take 2 tablets by mouth as needed.   Marland Kitchen ibuprofen (ADVIL,MOTRIN) 200 MG tablet Take 400 mg by mouth every 4 (four) hours as needed.   Marland Kitchen levonorgestrel (MIRENA) 20 MCG/24HR IUD 1 each by Intrauterine route once. 11/22/2012: Mirena IUD inserted 08/12/2011  . albuterol (PROVENTIL HFA;VENTOLIN HFA) 108 (90 BASE) MCG/ACT inhaler Inhale 2 puffs into the lungs every 6 (six) hours as needed. For shortness of breath 12/12/2013: Not needing  . methocarbamol (ROBAXIN) 500 MG tablet Take 1 tablet (500 mg total) by mouth every 8 (eight) hours as needed for muscle spasms. 12/12/2013: Not needing   No Known Allergies  ROS:  No known fever or chills, no nausea, vomiting, abdominal pain, diarrhea, rashes or other complaints. No chest pain or shortness of breath.  No needing to use her albuterol or robaxin.  PHYSICAL EXAM: BP 122/72  Pulse 72  Temp(Src) 99.6 F (37.6 C) (Tympanic)  Ht  (1.651 m)  Wt 154 lb (69.854 kg)  BMI 25.63 kg/m2  Well developed, pleasant female in no distress HEENT: PERRL, EOMI, conjunctiva clear.  TM's and EAC's normal.  Nasal mucosa is mildly edematous, mod on the left with clear mucus.  Sinuses are nontender. OP:  Tonsils are absent.  There is erythema bilaterally, and centrally posteriorly. No exudate.  Moist mucus membranes. Neck: some mildly tender shotty lymphadenopathy R>L Heart: regular rate and rhythm without murmur Lungs: clear bilaterally Skin: no rashes  Rapid strep:  Negative  ASSESSMENT/PLAN:  Sore throat  - Plan: Rapid Strep A  Pharyngitis.  Suspect viral.  R/o strep with confirmatory DNA probe.  Supportive measures reviewed.

## 2013-12-12 NOTE — Patient Instructions (Signed)
We are sending the swab out for confirmatory testing (we can sometimes see false negative rapid strep results).  We will be in touch tomorrow.  Call the office if you haven't heard results before 5 o'clock.  Supportive measures include tylenol and/or ibuprofen for pain relief, salt water gargles, hot/cold liquids, chloraseptic spray. Postnasal drainage can also contribute to sore throat, so use sinus or allergy medication as needed  Pharyngitis Pharyngitis is redness, pain, and swelling (inflammation) of your pharynx.  CAUSES  Pharyngitis is usually caused by infection. Most of the time, these infections are from viruses (viral) and are part of a cold. However, sometimes pharyngitis is caused by bacteria (bacterial). Pharyngitis can also be caused by allergies. Viral pharyngitis may be spread from person to person by coughing, sneezing, and personal items or utensils (cups, forks, spoons, toothbrushes). Bacterial pharyngitis may be spread from person to person by more intimate contact, such as kissing.  SIGNS AND SYMPTOMS  Symptoms of pharyngitis include:   Sore throat.   Tiredness (fatigue).   Low-grade fever.   Headache.  Joint pain and muscle aches.  Skin rashes.  Swollen lymph nodes.  Plaque-like film on throat or tonsils (often seen with bacterial pharyngitis). DIAGNOSIS  Your health care provider will ask you questions about your illness and your symptoms. Your medical history, along with a physical exam, is often all that is needed to diagnose pharyngitis. Sometimes, a rapid strep test is done. Other lab tests may also be done, depending on the suspected cause.  TREATMENT  Viral pharyngitis will usually get better in 3-4 days without the use of medicine. Bacterial pharyngitis is treated with medicines that kill germs (antibiotics).  HOME CARE INSTRUCTIONS   Drink enough water and fluids to keep your urine clear or pale yellow.   Only take over-the-counter or  prescription medicines as directed by your health care provider:   If you are prescribed antibiotics, make sure you finish them even if you start to feel better.   Do not take aspirin.   Get lots of rest.   Gargle with 8 oz of salt water ( tsp of salt per 1 qt of water) as often as every 1-2 hours to soothe your throat.   Throat lozenges (if you are not at risk for choking) or sprays may be used to soothe your throat. SEEK MEDICAL CARE IF:   You have large, tender lumps in your neck.  You have a rash.  You cough up green, yellow-brown, or bloody spit. SEEK IMMEDIATE MEDICAL CARE IF:   Your neck becomes stiff.  You drool or are unable to swallow liquids.  You vomit or are unable to keep medicines or liquids down.  You have severe pain that does not go away with the use of recommended medicines.  You have trouble breathing (not caused by a stuffy nose). MAKE SURE YOU:   Understand these instructions.  Will watch your condition.  Will get help right away if you are not doing well or get worse. Document Released: 03/07/2005 Document Revised: 12/26/2012 Document Reviewed: 11/12/2012 [facility] Patient Information 2015 [place], [place]. This information is not intended to replace advice given to you by your health care provider. Make sure you discuss any questions you have with your health care provider.

## 2013-12-13 LAB — STREP A DNA PROBE: GASP: NEGATIVE

## 2014-01-03 ENCOUNTER — Other Ambulatory Visit: Payer: Self-pay

## 2014-01-20 ENCOUNTER — Encounter: Payer: Self-pay | Admitting: Family Medicine

## 2014-04-24 ENCOUNTER — Encounter: Payer: Self-pay | Admitting: Family Medicine

## 2014-04-24 ENCOUNTER — Ambulatory Visit (INDEPENDENT_AMBULATORY_CARE_PROVIDER_SITE_OTHER): Payer: Managed Care, Other (non HMO) | Admitting: Family Medicine

## 2014-04-24 VITALS — BP 136/82 | HR 88 | Ht 65.0 in | Wt 142.0 lb

## 2014-04-24 DIAGNOSIS — F411 Generalized anxiety disorder: Secondary | ICD-10-CM

## 2014-04-24 MED ORDER — ESCITALOPRAM OXALATE 10 MG PO TABS: 10.0000 mg | ORAL_TABLET | Freq: Every day | ORAL | Status: DC

## 2014-04-24 MED ORDER — ALPRAZOLAM 0.25 MG PO TABS: 0.2500 mg | ORAL_TABLET | Freq: Three times a day (TID) | ORAL | Status: DC | PRN

## 2014-04-24 NOTE — Progress Notes (Signed)
Chief Complaint  Patient presents with  . Advice Only    has been having crying spells more often than usuall x 3 weeks off and on. Has been feeling more irritable over the last few months.    Something happened at work yesterday, that normally wouldn't faze her, and she started crying, couldn't stop, and had to leave work.  Wasn't herself the rest of her day.  This was something she should have been able to handle. She has been more irritable, everything bothers her more.  Symptoms have been going on for a couple of months (less than 6), gradually getting worse.  The trouble sleeping has only started in the last week.  She wakes up frequently, but can get back to sleep.  She feels anxious, stressed, some squeezing in her chest while she is at work.  Last March there was a change at her job (been on her own, doesn't have help).  No changes since then.  Denies marital stress; some mild financial stress, but it seems mostly to be work stress.  She started exercising in January (21 day fit program).  This has helped her energy. When her nerves were bad she started eating a lot--she has been on a "diet" and watching that, cutting back on calories since she realized that.  Feels better since exercising and eating better.   Doesn't have as much patience as she used to.  She denies feeling depressed or sad, just anxious.  She has h/o anxiety, and recalls being treated with xanax, and something else, she thinks Lexapro. This was in a prior marriage, while he was deployed in [place], followed by PTSD (2006). Doesn't recall any side effects.  PMH, PSH, SH and family history were all reviewed/updated.  Current Outpatient Prescriptions on File Prior to Visit  Medication Sig Dispense Refill  . levonorgestrel (MIRENA) 20 MCG/24HR IUD 1 each by Intrauterine route once.    Marland Kitchen. albuterol (PROVENTIL HFA;VENTOLIN HFA) 108 (90 BASE) MCG/ACT inhaler Inhale 2 puffs into the lungs every 6 (six) hours as needed. For  shortness of breath    . ibuprofen (ADVIL,MOTRIN) 200 MG tablet Take 400 mg by mouth every 4 (four) hours as needed.     No current facility-administered medications on file prior to visit.   No Known Allergies  ROS:  No URI symptoms, headaches, shortness of breath, GI complaints, GU complaints, rash or other concerns, just moods as per HPI. Chest squeezing related to anxiety.  No exertional chest pain or shortness of breath.  PHYSICAL EXAM: BP 136/82 mmHg  Pulse 88  Ht 5\' 5"  (1.651 m)  Wt 142 lb (64.411 kg)  BMI 23.63 kg/m2 Pleasant, well-appearing female in no distress She has full range of affect, at times was almost teary during conversation, other times smiling, laughing.  She has normal hygiene, grooming, eye contact and speech  ASSESSMENT/PLAN:  Generalized anxiety disorder - Plan: escitalopram (LEXAPRO) 10 MG tablet, ALPRAZolam (XANAX) 0.25 MG tablet  lexapro 10mg --start at 1/2 tablet x 1 week Alprazolam 0.25mg  prn Risks/side effects reviewed in detail with patient, including risks of drowsiness and to use caution in taking alprazolam during the day/driving.  Counseled not to use nightly for sleepy, just short-term. Encouraged counseling, and provided counseling re: stress reduction, relaxation techniques, prioritizing, communicating with others, asking for help.  F/u 4-6 weeks  25 min visit, more than 1/2 spent counseling

## 2014-04-24 NOTE — Patient Instructions (Signed)
Start the lexapro at 1/2 tablet once daily for a week.  Take in the morning; change to evening only if it makes you sleepy.  After a week, if tolerating without significant side effects, increase to taking a full tablet every morning. Use the alprazolam sparingly, only if needed for significant anxiety.  It may make you sleepy, so use caution if taking during the day when working/driving. You may take up to 2 tablets at bedtime, if needing in the evening, and planning to sleep, if 1 tablet isn't effective.  I encourage you to seek counseling.  Check to see if there is an employee assistance program through your job (to save $).  Andrea AndreasJulie Boyer is a Veterinary surgeoncounselor through [employer] that I like a lot--if you don't have an EAP, look into calling her for an appointment.

## 2014-05-22 ENCOUNTER — Encounter: Payer: Managed Care, Other (non HMO) | Admitting: Family Medicine

## 2014-08-25 ENCOUNTER — Ambulatory Visit (INDEPENDENT_AMBULATORY_CARE_PROVIDER_SITE_OTHER): Payer: Managed Care, Other (non HMO) | Admitting: Medical

## 2014-08-25 ENCOUNTER — Encounter: Payer: Self-pay | Admitting: Medical

## 2014-08-25 VITALS — BP 110/80 | HR 71 | Temp 98.2°F | Resp 16 | Wt 137.0 lb

## 2014-08-25 DIAGNOSIS — F411 Generalized anxiety disorder: Secondary | ICD-10-CM

## 2014-08-25 DIAGNOSIS — H68003 Unspecified Eustachian salpingitis, bilateral: Secondary | ICD-10-CM

## 2014-08-25 MED ORDER — ALPRAZOLAM 0.25 MG PO TABS: 0.2500 mg | ORAL_TABLET | Freq: Three times a day (TID) | ORAL | Status: DC | PRN

## 2014-08-25 MED ORDER — ESCITALOPRAM OXALATE 10 MG PO TABS: 10.0000 mg | ORAL_TABLET | Freq: Every day | ORAL | Status: DC

## 2014-08-25 NOTE — Progress Notes (Signed)
Subjective:   Andrea Boyer is a 34 y.o. female who presents with ear pain and possible ear infection. Symptoms include: right ear pain with radiation down right neck. Onset of symptoms was 1 week ago, and have been unchanged since that time. Associated symptoms include: none.  Patient denies: achiness, chills, congestion, coryza, fever , headache, low grade fever, sinus pressure, sneezing and sore throat. She is drinking plenty of fluids.  Treatment to date: antihistamines.  Denies sick contacts.  No other aggravating or relieving factors.    Also here for recheck on Lexapro, anxiety.  Overall medication seems to help, takes xanax occasionally.  Main issues is work related stress.  Louann SjogrenBoss has unrealistic expectations and staffing at her job is awful.  Medication has helped her deal with the stress better.    No other c/o.  ROS as in subjective  Objective:  Filed Vitals:   08/25/14 1206  BP: 110/80  Pulse: 71  Temp: 98.2 F (36.8 C)  Resp: 16    General appearance: Alert, WD/WN, no distress                             Skin: warm, no rash                           Head: no sinus tenderness                            Eyes: conjunctiva normal, corneas clear, PERRLA                            Ears: TMs retracted, but no erythema, external ear canals normal                          Nose: septum midline, turbinates swollen, with erythema and clear discharge             Mouth/throat: MMM, tongue normal, mild pharyngeal erythema                           Neck: supple, no adenopathy, no thyromegaly, nontender                          Heart: RRR, normal S1, S2, no murmurs                         Lungs: CTA bilaterally, no wheezes, rales, or rhonchi   Psych: pleasant good eye contact, answers questions appropriately  Assessment: Encounter Diagnoses  Name Primary?  . Eustachian salpingitis, bilateral Yes  . Generalized anxiety disorder       Plan: discussed diagnosis and treatment of  eustachian tube dysfunction.   C/t zyrtec, begin Sudafed x 1 wk, restart her flonase she has at home this week.  Tylenol or Ibuprofen OTC for fever and malaise.  If not improving by end of the week, call back.  Call/return in 2-3 days if symptoms aren't resolving.   Anxiety - c/t current medications, counseled on ways to cope and deal with her stressors and work stress.  Fu 83mo

## 2014-08-28 ENCOUNTER — Telehealth: Payer: Self-pay | Admitting: Medical

## 2014-08-28 ENCOUNTER — Other Ambulatory Visit: Payer: Self-pay | Admitting: Medical

## 2014-08-28 MED ORDER — ANTIPYRINE-BENZOCAINE 5.4-1.4 % OT SOLN: 3.0000 [drp] | OTIC | Status: DC | PRN

## 2014-08-28 MED ORDER — AMOXICILLIN 875 MG PO TABS: 875.0000 mg | ORAL_TABLET | Freq: Two times a day (BID) | ORAL | Status: DC

## 2014-08-28 NOTE — Telephone Encounter (Signed)
Andrea Hews, do you want me to call something in?

## 2014-08-28 NOTE — Telephone Encounter (Signed)
Begin amoxicillin for possible ear/sinus infection, and I sent ear pain drop. Can use OTC Ibuprofen 3-4 tablets every 6 hours for pain

## 2014-08-28 NOTE — Telephone Encounter (Signed)
LM to CB

## 2014-08-28 NOTE — Telephone Encounter (Signed)
Patient notified

## 2014-08-28 NOTE — Telephone Encounter (Signed)
Pt. Called in asking for pain medication for her rt. Ear pain that she and shane had disscussed at her last appt on 08-25-14.  Pt. States shane asked her to call back in to have the medication filled if the pain does not go away. When I asked the name of the medication the pt. Does not recall.   Contact Vianey @ [phone] Send to [employer] phone # [phone]

## 2014-11-03 ENCOUNTER — Ambulatory Visit (INDEPENDENT_AMBULATORY_CARE_PROVIDER_SITE_OTHER): Payer: Managed Care, Other (non HMO) | Admitting: Medical

## 2014-11-03 ENCOUNTER — Other Ambulatory Visit: Payer: Self-pay | Admitting: Medical

## 2014-11-03 ENCOUNTER — Encounter: Payer: Self-pay | Admitting: Medical

## 2014-11-03 ENCOUNTER — Other Ambulatory Visit: Payer: Self-pay

## 2014-11-03 ENCOUNTER — Telehealth: Payer: Self-pay | Admitting: Medical

## 2014-11-03 VITALS — BP 108/78 | HR 98 | Temp 98.6°F | Resp 18 | Wt 140.0 lb

## 2014-11-03 DIAGNOSIS — J4531 Mild persistent asthma with (acute) exacerbation: Secondary | ICD-10-CM

## 2014-11-03 DIAGNOSIS — R52 Pain, unspecified: Secondary | ICD-10-CM | POA: Diagnosis not present

## 2014-11-03 DIAGNOSIS — R05 Cough: Secondary | ICD-10-CM

## 2014-11-03 DIAGNOSIS — R059 Cough, unspecified: Secondary | ICD-10-CM

## 2014-11-03 MED ORDER — AZITHROMYCIN 250 MG PO TABS: ORAL_TABLET | ORAL | Status: DC

## 2014-11-03 MED ORDER — BUDESONIDE-FORMOTEROL FUMARATE 160-4.5 MCG/ACT IN AERO: 2.0000 | INHALATION_SPRAY | Freq: Two times a day (BID) | RESPIRATORY_TRACT | Status: DC

## 2014-11-03 MED ORDER — METHYLPREDNISOLONE 4 MG PO TABS: ORAL_TABLET | ORAL | Status: DC

## 2014-11-03 MED ORDER — ALBUTEROL SULFATE 108 (90 BASE) MCG/ACT IN AEPB: 2.0000 | INHALATION_SPRAY | RESPIRATORY_TRACT | Status: DC | PRN

## 2014-11-03 NOTE — Telephone Encounter (Addendum)
PT CALLED and was seen this morning and talked to u about legionaires and her job called and stated that the test come back positive for the disease and they wanted her to get tested for it, i can schedule her for a lab appt if you can put the orders in.pt also can be reached at [phone]

## 2014-11-03 NOTE — Progress Notes (Signed)
Subjective:  Andrea Boyer is a 34 y.o. female who presents for possible pneumonia, wheezing.  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  Symptoms include 2 week hx/o cold symptoms, cough, sore throat, ear pain, congestion, but asthma started flaring up since 5 days ago and having productive sputum.   Denies fever, NVD.  Treatment to date: cough suppressants and albuterol some including 8am this morning.  She does not smoke.   She does have a history of asthma.   No other aggravating or relieving factors.  No other c/o.  The following portions of the patient's history were reviewed and updated as appropriate: allergies, current medications, past family history, past medical history, past social history, past surgical history and problem list.  ROS as in subjective  Past Medical History  Diagnosis Date  . Pneumonia 2010  . Elevated prolactin level 10/2006    NEGATIVE MRI  . IBS (irritable bowel syndrome)   . SVD (spontaneous vaginal delivery)     x 1  . PONV (postoperative nausea and vomiting)   . Anxiety     history - no current problems  . PIH (pregnancy induced hypertension) 06/2011    PIH resolved after delivery   . Asthma     exercise induced rarely uses inhaler  . History of kidney stones     passes stones - no surgery required     Objective: BP 108/78 mmHg  Pulse 98  Temp(Src) 98.6 F (37 C) (Oral)  Resp 18  Wt 140 lb (63.504 kg)  SpO2 99%'  General appearance: Alert, WD/WN, no distress, ill appearing, audible wheezing                             Skin: warm, no rash, no diaphoresis                           Head: mild sinus tenderness                            Eyes: conjunctiva normal, corneas clear, PERRLA                            Ears: flat TMs bilat, scar tissue of TMs bilat from prior tubes, external ear canals normal                          Nose: septum midline, turbinates swollen, with erythema and  clear discharge             Mouth/throat: MMM, tongue normal, mild pharyngeal erythema                           Neck: supple, no adenopathy, no thyromegaly, nontender                          Heart: RRR, normal S1, S2, no murmurs                         Lungs: initially decreased breath sounds throughout, +wheezing throughout                 Extremities: no edema, non tender  Assessment: Encounter Diagnoses  Name Primary?  Marland Kitchen Asthma with acute exacerbation, mild persistent Yes  . Body aches   . Cough      Plan:  Shortly after triage gave 1 round of albuterol nebulized.   After recheck, gave second round since she had little improvement.  She did respond a little better to second round with improved lung sounds.   She declines CXR.   Discussed her symptoms, findings, and begin Zpak antibiotic (which would also cover legionnaires in addition to first line for pneumonia), begin Symbicort BID, refilled albuterol and she needs to use this q4-6 hours the next several days, and begin Medrol dose pak.  Rest, gave note for work, and advised if not much improved in 24-48 hours to call or return.  Otherwise f/u later this week by phone.    Recheck in 34mo in genral on Symbicort.   Andrea Boyer was seen today for acute visit.  Diagnoses and all orders for this visit:  Asthma with acute exacerbation, mild persistent -     PR ALBUTEROL IPRATROP NON-COMP -     Pulse oximetry (single); Future  Body aches -     PR ALBUTEROL IPRATROP NON-COMP -     Pulse oximetry (single); Future  Cough -     PR ALBUTEROL IPRATROP NON-COMP -     Pulse oximetry (single); Future  Other orders -     Cancel: PR ALBUTEROL IPRATROP NON-COMP; Standing -     Cancel: PR ALBUTEROL IPRATROP NON-COMP; Standing -     PR INHAL RX, AIRWAY OBST/DX SPUTUM INDUCT; Standing -     budesonide-formoterol (SYMBICORT) 160-4.5 MCG/ACT inhaler; Inhale 2 puffs into the lungs 2 (two) times daily. -     Albuterol Sulfate (PROAIR RESPICLICK)  108 (90 BASE) MCG/ACT AEPB; Inhale 2 puffs into the lungs every 4 (four) hours as needed. -     azithromycin (ZITHROMAX) 250 MG tablet; 2 tablets day 1, then 1 tablet days 2-4 -     methylPREDNISolone (MEDROL) 4 MG tablet; 6/5/4/3/2/1 taper -     PR INHAL RX, AIRWAY OBST/DX SPUTUM INDUCT

## 2014-11-03 NOTE — Telephone Encounter (Signed)
I gave her an antibiotic that will cover for legionaires in addition to other forms of pneumonia.  So in terms of treatment, she is covered.    Does the employer have a specific result they can share with [place]?  Do they have a specific lab they want to test?  Or are they using an employer lab/doctor for this?  The only tests I am aware of is sputum culture (which she would have to cough up sputum in sterile cup to submit to lab - see [facility]) or Urine antigen test that I am not sure we have (we can check with [facility]).

## 2014-11-03 NOTE — Telephone Encounter (Signed)
Pt informed can not find lab order asked jo she cant find one I told the pt I would call her back in the morning to let her know if we can do test or not

## 2014-11-04 ENCOUNTER — Other Ambulatory Visit: Payer: Managed Care, Other (non HMO)

## 2014-11-04 DIAGNOSIS — R05 Cough: Secondary | ICD-10-CM

## 2014-11-04 DIAGNOSIS — R059 Cough, unspecified: Secondary | ICD-10-CM

## 2014-11-04 DIAGNOSIS — J4531 Mild persistent asthma with (acute) exacerbation: Secondary | ICD-10-CM

## 2014-11-04 DIAGNOSIS — R52 Pain, unspecified: Secondary | ICD-10-CM

## 2014-11-05 ENCOUNTER — Other Ambulatory Visit: Payer: Self-pay | Admitting: Medical

## 2014-11-05 ENCOUNTER — Telehealth: Payer: Self-pay | Admitting: *Deleted

## 2014-11-05 MED ORDER — ALBUTEROL SULFATE (2.5 MG/3ML) 0.083% IN NEBU: 2.5000 mg | INHALATION_SOLUTION | Freq: Four times a day (QID) | RESPIRATORY_TRACT | Status: DC | PRN

## 2014-11-05 NOTE — Telephone Encounter (Signed)
Please call back Thursday morning to check on patient . I called out nebulizer and albuterol last night.    She is suppose to be using Symbicort BID, and albuterol 3-4 times daily.  F/u 1wk.

## 2014-11-05 NOTE — Telephone Encounter (Signed)
Patient called in @ 5:00 pm with questions concerning her inhalers prescribed on Mon 11/03/14. I explained the Proventil was her rescue inhaler & the Symbicort is used 2 x daily. But I can hear her wheezing heavily. She stated that she became short of breathe when she got up to start cooking. And that she wanted to try to go back to work on tomorrow. I asked if she had a Nebulizer machine, she said no.  I told her that she needs to go to the emergency room or outpatient to get a treatment because she is wheezing terribly. She asked if she could get another work excuse if she did. I said to her to please not let a work excuse keep her from seeking the medical help she needs.  That she could always get another work excuse.

## 2014-11-06 LAB — LEGIONELLA ANTIGEN, URINE
RESULTS - LGAGUR: NOT DETECTED
Result - LGAGUR: DETECTED
Result - LGAGUR: NEGATIVE

## 2014-11-06 NOTE — Telephone Encounter (Signed)
See message to call patient

## 2014-11-06 NOTE — Telephone Encounter (Signed)
Improving with neub.and inhalers. Will call back if she does not continue to improve.

## 2014-12-24 ENCOUNTER — Ambulatory Visit (INDEPENDENT_AMBULATORY_CARE_PROVIDER_SITE_OTHER): Payer: Managed Care, Other (non HMO) | Admitting: Family Medicine

## 2014-12-24 ENCOUNTER — Encounter: Payer: Self-pay | Admitting: Family Medicine

## 2014-12-24 VITALS — BP 130/84 | HR 76 | Temp 97.9°F | Wt 142.6 lb

## 2014-12-24 DIAGNOSIS — G44221 Chronic tension-type headache, intractable: Secondary | ICD-10-CM | POA: Diagnosis not present

## 2014-12-24 DIAGNOSIS — R42 Dizziness and giddiness: Secondary | ICD-10-CM

## 2014-12-24 LAB — COMPREHENSIVE METABOLIC PANEL
ALK PHOS: 32 U/L — AB (ref 33–115)
ALT: 15 U/L (ref 6–29)
AST: 14 U/L (ref 10–30)
Albumin: 4.8 g/dL (ref 3.6–5.1)
BILIRUBIN TOTAL: 0.5 mg/dL (ref 0.2–1.2)
BUN: 14 mg/dL (ref 7–25)
CO2: 26 mmol/L (ref 20–31)
CREATININE: 0.63 mg/dL (ref 0.50–1.10)
Calcium: 9.4 mg/dL (ref 8.6–10.2)
Chloride: 107 mmol/L (ref 98–110)
GLUCOSE: 88 mg/dL (ref 65–99)
Potassium: 4.2 mmol/L (ref 3.5–5.3)
SODIUM: 139 mmol/L (ref 135–146)
Total Protein: 6.8 g/dL (ref 6.1–8.1)

## 2014-12-24 LAB — CBC WITH DIFFERENTIAL/PLATELET
BASOS ABS: 0.1 10*3/uL (ref 0.0–0.1)
BASOS PCT: 1 % (ref 0–1)
EOS PCT: 1 % (ref 0–5)
Eosinophils Absolute: 0.1 10*3/uL (ref 0.0–0.7)
HCT: 41 % (ref 36.0–46.0)
Hemoglobin: 14 g/dL (ref 12.0–15.0)
LYMPHS PCT: 30 % (ref 12–46)
Lymphs Abs: 1.7 10*3/uL (ref 0.7–4.0)
MCH: 30.6 pg (ref 26.0–34.0)
MCHC: 34.1 g/dL (ref 30.0–36.0)
MCV: 89.7 fL (ref 78.0–100.0)
MONO ABS: 0.5 10*3/uL (ref 0.1–1.0)
MPV: 11.4 fL (ref 8.6–12.4)
Monocytes Relative: 8 % (ref 3–12)
NEUTROS ABS: 3.4 10*3/uL (ref 1.7–7.7)
Neutrophils Relative %: 60 % (ref 43–77)
Platelets: 217 10*3/uL (ref 150–400)
RBC: 4.57 MIL/uL (ref 3.87–5.11)
RDW: 12.8 % (ref 11.5–15.5)
WBC: 5.7 10*3/uL (ref 4.0–10.5)

## 2014-12-24 MED ORDER — TRAMADOL HCL 50 MG PO TABS: 50.0000 mg | ORAL_TABLET | Freq: Three times a day (TID) | ORAL | Status: DC | PRN

## 2014-12-24 MED ORDER — MECLIZINE HCL 25 MG PO TABS: 25.0000 mg | ORAL_TABLET | Freq: Two times a day (BID) | ORAL | Status: DC | PRN

## 2014-12-24 NOTE — Patient Instructions (Signed)
Benign Positional Vertigo Vertigo is the feeling that you or your surroundings are moving when they are not. Benign positional vertigo is the most common form of vertigo. The cause of this condition is not serious (is benign). This condition is triggered by certain movements and positions (is positional). This condition can be dangerous if it occurs while you are doing something that could endanger you or others, such as driving.  CAUSES In many cases, the cause of this condition is not known. It may be caused by a disturbance in an area of the inner ear that helps your brain to sense movement and balance. This disturbance can be caused by a viral infection (labyrinthitis), head injury, or repetitive motion. RISK FACTORS This condition is more likely to develop in:  Women.  People who are 50 years of age or older. SYMPTOMS Symptoms of this condition usually happen when you move your head or your eyes in different directions. Symptoms may start suddenly, and they usually last for less than a minute. Symptoms may include:  Loss of balance and falling.  Feeling like you are spinning or moving.  Feeling like your surroundings are spinning or moving.  Nausea and vomiting.  Blurred vision.  Dizziness.  Involuntary eye movement (nystagmus). Symptoms can be mild and cause only slight annoyance, or they can be severe and interfere with daily life. Episodes of benign positional vertigo may return (recur) over time, and they may be triggered by certain movements. Symptoms may improve over time. DIAGNOSIS This condition is usually diagnosed by medical history and a physical exam of the head, neck, and ears. You may be referred to a health care provider who specializes in ear, nose, and throat (ENT) problems (otolaryngologist) or a provider who specializes in disorders of the nervous system (neurologist). You may have additional testing, including:  MRI.  A CT scan.  Eye movement tests. Your  health care provider may ask you to change positions quickly while he or she watches you for symptoms of benign positional vertigo, such as nystagmus. Eye movement may be tested with an electronystagmogram (ENG), caloric stimulation, the Dix-Hallpike test, or the roll test.  An electroencephalogram (EEG). This records electrical activity in your brain.  Hearing tests. TREATMENT Usually, your health care provider will treat this by moving your head in specific positions to adjust your inner ear back to normal. Surgery may be needed in severe cases, but this is rare. In some cases, benign positional vertigo may resolve on its own in 2-4 weeks. HOME CARE INSTRUCTIONS Safety  Move slowly.Avoid sudden body or head movements.  Avoid driving.  Avoid operating heavy machinery.  Avoid doing any tasks that would be dangerous to you or others if a vertigo episode would occur.  If you have trouble walking or keeping your balance, try using a cane for stability. If you feel dizzy or unstable, sit down right away.  Return to your normal activities as told by your health care provider. Ask your health care provider what activities are safe for you. General Instructions  Take over-the-counter and prescription medicines only as told by your health care provider.  Avoid certain positions or movements as told by your health care provider.  Drink enough fluid to keep your urine clear or pale yellow.  Keep all follow-up visits as told by your health care provider. This is important. SEEK MEDICAL CARE IF:  You have a fever.  Your condition gets worse or you develop new symptoms.  Your family or friends   notice any behavioral changes.  Your nausea or vomiting gets worse.  You have numbness or a "pins and needles" sensation. SEEK IMMEDIATE MEDICAL CARE IF:  You have difficulty speaking or moving.  You are always dizzy.  You faint.  You develop severe headaches.  You have weakness in your  legs or arms.  You have changes in your hearing or vision.  You develop a stiff neck.  You develop sensitivity to light.   This information is not intended to replace advice given to you by your health care provider. Make sure you discuss any questions you have with your health care provider.   Document Released: 12/13/2005 Document Revised: 11/26/2014 Document Reviewed: 06/30/2014 Elsevier Interactive Patient Education 2016 [employer]  Tension Headache A tension headache is a feeling of pain, pressure, or aching that is often felt over the front and sides of the head. The pain can be dull, or it can feel tight (constricting). Tension headaches are not normally associated with nausea or vomiting, and they do not get worse with physical activity. Tension headaches can last from 30 minutes to several days. This is the most common type of headache. CAUSES The exact cause of this condition is not known. Tension headaches often begin after stress, anxiety, or depression. Other triggers may include:  Alcohol.  Too much caffeine, or caffeine withdrawal.  Respiratory infections, such as colds, flu, or sinus infections.  Dental problems or teeth clenching.  Fatigue.  Holding your head and neck in the same position for a long period of time, such as while using a computer.  Smoking. SYMPTOMS Symptoms of this condition include:  A feeling of pressure around the head.  Dull, aching head pain.  Pain felt over the front and sides of the head.  Tenderness in the muscles of the head, neck, and shoulders. DIAGNOSIS This condition may be diagnosed based on your symptoms and a physical exam. Tests may be done, such as a CT scan or an MRI of your head. These tests may be done if your symptoms are severe or unusual. TREATMENT This condition may be treated with lifestyle changes and medicines to help relieve symptoms. HOME CARE INSTRUCTIONS Managing Pain  Take over-the-counter and  prescription medicines only as told by your health care provider.  Lie down in a dark, quiet room when you have a headache.  If directed, apply ice to the head and neck area:  Put ice in a plastic bag.  Place a towel between your skin and the bag.  Leave the ice on for 20 minutes, 2-3 times per day.  Use a heating pad or a hot shower to apply heat to the head and neck area as told by your health care provider. Eating and Drinking  Eat meals on a regular schedule.  Limit alcohol use.  Decrease your caffeine intake, or stop using caffeine. General Instructions  Keep all follow-up visits as told by your health care provider. This is important.  Keep a headache journal to help find out what may trigger your headaches. For example, write down:  What you eat and drink.  How much sleep you get.  Any change to your diet or medicines.  Try massage or other relaxation techniques.  Limit stress.  Sit up straight, and avoid tensing your muscles.  Do not use tobacco products, including cigarettes, chewing tobacco, or e-cigarettes. If you need help quitting, ask your health care provider.  Exercise regularly as told by your health care provider.  Get  7-9 hours of sleep, or the amount recommended by your health care provider. SEEK MEDICAL CARE IF:  Your symptoms are not helped by medicine.  You have a headache that is different from what you normally experience.  You have nausea or you vomit.  You have a fever. SEEK IMMEDIATE MEDICAL CARE IF:  Your headache becomes severe.  You have repeated vomiting.  You have a stiff neck.  You have a loss of vision.  You have problems with speech.  You have pain in your eye or ear.  You have muscular weakness or loss of muscle control.  You lose your balance or you have trouble walking.  You feel faint or you pass out.  You have confusion.   This information is not intended to replace advice given to you by your health  care provider. Make sure you discuss any questions you have with your health care provider.   Document Released: 03/07/2005 Document Revised: 11/26/2014 Document Reviewed: 06/30/2014 Elsevier Interactive Patient Education [employer]

## 2014-12-24 NOTE — Progress Notes (Signed)
Subjective:    Patient ID: Andrea Boyer, female    DOB: 1980-06-23, 34 y.o.   MRN: [id number]  HPI She is here for complaints of persistent occipital headaches for past 4-6 weeks that she describes as pressure to the back of her head and tightness to her neck.. She states sometimes she wakes up with the headache and other times the headaches start within 30 minutes of waking up. She states her headache today is a 7 out of 10 and that they do get as bad as 9 out of 10. Headaches are not keeping her awake at night. She reports having headaches every day and has gotten no relief with over-the-counter Tylenol, Aleve, Excedrin migraine.   She is also complaining of intermittent dizziness since for past 3 months that she describes as a spinning sensation and is made worse with lying down. The nighttime dizziness does subside and she is able to sleep.  She states she initially feels lightheaded at onset of dizziness then the spinning ensues and lasts until she is able to sit still for a while and then it usually improves. She states she went to [facility] emergency department the end of June with dizziness and had a negative head CT and workup. She reports associated nausea and 3 days ago also had 2 episodes of vomiting. Denies tinnitus or hearing loss. Also denies numbness, tingling, or weakness.   Reviewed allergies, medications, past medical, social history.   Review of Systems Pertinent positives and negatives in the history of present illness.    Objective:   Physical Exam  Constitutional: She is oriented to person, place, and time. She appears well-developed and well-nourished. No distress.  HENT:  Head: Normocephalic and atraumatic.  Right Ear: Ear canal normal. Tympanic membrane is scarred. No decreased hearing is noted.  Left Ear: Ear canal normal. Tympanic membrane is scarred. No decreased hearing is noted.  Nose: Nose normal.  Mouth/Throat: Uvula is midline, oropharynx is clear and  moist and mucous membranes are normal. No oropharyngeal exudate.  Eyes: EOM are normal. Pupils are equal, round, and reactive to light. Right eye exhibits no nystagmus. Left eye exhibits no nystagmus.  Neck: Normal range of motion. Neck supple.  Cardiovascular: Normal rate, regular rhythm, normal heart sounds and normal pulses.   No murmur heard. Pulmonary/Chest: Effort normal and breath sounds normal. She has no wheezes. She has no rhonchi.  Lymphadenopathy:       Head (right side): No submental, no submandibular, no tonsillar, no preauricular, no posterior auricular and no occipital adenopathy present.       Head (left side): No submental, no submandibular, no tonsillar, no preauricular, no posterior auricular and no occipital adenopathy present.    She has no cervical adenopathy.    She has no axillary adenopathy.       Right: No supraclavicular adenopathy present.       Left: No supraclavicular adenopathy present.  Neurological: She is alert and oriented to person, place, and time. She has normal strength and normal reflexes. No cranial nerve deficit or sensory deficit. She displays a negative Romberg sign. Gait normal.  Normal proprioception  Skin: Skin is warm and dry. No cyanosis. No pallor.  Psychiatric: She has a normal mood and affect. Her speech is normal and behavior is normal. Judgment and thought content normal. Cognition and memory are normal.      Orthostatic vitals: negative for postural    Assessment & Plan:  Dizziness and giddiness - Plan: CBC  with Differential/Platelet, Comprehensive metabolic panel, Orthostatic vital signs, meclizine (ANTIVERT) 25 MG tablet  Chronic tension-type headache, intractable - Plan: CBC with Differential/Platelet, Comprehensive metabolic panel, traMADol (ULTRAM) 50 MG tablet  Her neurological exam is normal, she had a negative head CT approximately 3 months ago at the onset of symptoms. She is not postural. Her dizziness appears to be  triggered by movement, particularly with lying down. Discussed that this appears to be benign positional vertigo and recommend trying meclizine for the next 4-5 days and see if this helps. Discussed that this could also be related to an underlying sinus infection or allergies, however, she does not have classic symptoms for this currently. Suspect that the nausea and vomiting associated with the dizziness and possibly pain from her headaches. Discussed tension type headaches and she will try a course of tramadol for these. She will follow up as needed or sooner pending labs.

## 2014-12-29 ENCOUNTER — Encounter: Payer: Self-pay | Admitting: Medical

## 2014-12-31 ENCOUNTER — Other Ambulatory Visit: Payer: Self-pay | Admitting: Medical

## 2014-12-31 MED ORDER — CYCLOBENZAPRINE HCL 5 MG PO TABS: 5.0000 mg | ORAL_TABLET | Freq: Every day | ORAL | Status: DC

## 2015-01-12 ENCOUNTER — Ambulatory Visit: Payer: Self-pay

## 2015-01-12 ENCOUNTER — Other Ambulatory Visit: Payer: Self-pay | Admitting: Occupational Medicine

## 2015-01-12 DIAGNOSIS — M79672 Pain in left foot: Secondary | ICD-10-CM

## 2015-02-17 ENCOUNTER — Ambulatory Visit (INDEPENDENT_AMBULATORY_CARE_PROVIDER_SITE_OTHER): Payer: Managed Care, Other (non HMO) | Admitting: Physician Assistant

## 2015-02-17 ENCOUNTER — Encounter: Payer: Self-pay | Admitting: Physician Assistant

## 2015-02-17 VITALS — BP 116/70 | HR 64 | Ht 65.0 in | Wt 140.0 lb

## 2015-02-17 DIAGNOSIS — K648 Other hemorrhoids: Secondary | ICD-10-CM | POA: Diagnosis not present

## 2015-02-17 DIAGNOSIS — K644 Residual hemorrhoidal skin tags: Secondary | ICD-10-CM

## 2015-02-17 MED ORDER — HYDROCORTISONE ACETATE 25 MG RE SUPP: 25.0000 mg | Freq: Two times a day (BID) | RECTAL | Status: DC

## 2015-02-17 MED ORDER — MENTHOL-ZINC OXIDE 0.44-20.625 % EX OINT: TOPICAL_OINTMENT | CUTANEOUS | Status: DC

## 2015-02-17 NOTE — Progress Notes (Signed)
Patient ID: Elsia Lasota, female   DOB: 01/16/1981, 34 y.o.   MRN: [id number]     History of Present Illness: Derrian Rodak who is known to Dr. Leone Payor with a history of IBS. She was last seen in October 2014 and states she has been doing fairly well. Her bowel movements have been regular.  Vergene presents here today with complaints of rectal discomfort. She states that about 5 or 6 months ago she noted a small lump near her anus. She states it occasionally gets sore and sometimes gets larger in size, and then will shrink down. It has not bled and has not had any kind of drainage. She has tried Preparation H cream, Vaseline, and Tucks wipes with little relief. She states it is now itchy. Though she is moving her bowels normally, she occasionally has a small amount of mucus with her stools and occasionally has sensation of incomplete evacuation. She occasionally feels a sharp pain when she moves her bowels that last for a few seconds. She has no abdominal pain.   Past Medical History  Diagnosis Date  . Pneumonia 2010  . Elevated prolactin level (HCC) 10/2006    NEGATIVE MRI  . IBS (irritable bowel syndrome)   . SVD (spontaneous vaginal delivery)     x 1  . PONV (postoperative nausea and vomiting)   . Anxiety     history - no current problems  . PIH (pregnancy induced hypertension) 06/2011    PIH resolved after delivery   . Asthma     exercise induced rarely uses inhaler  . History of kidney stones     passes stones - no surgery required    Past Surgical History  Procedure Laterality Date  . Tonsillectomy  1991  . Foot fracture surgery Right 2004    removed a bone from a fx foot  . Wisdom tooth extraction    . Laparoscopy N/A 11/29/2012    Procedure: LAPAROSCOPY OPERATIVE;  Surgeon: Oliver Pila, MD;  Location: [facility];  Service: Gynecology;  Laterality: N/A;  . Laparoscopic lysis of adhesions N/A 11/29/2012    Procedure: LAPAROSCOPIC LYSIS OF ADHESIONS;  Surgeon: Oliver Pila, MD;  Location: [facility];  Service: Gynecology;  Laterality: N/A;   Family History  Problem Relation Age of Onset  . Hypertension Mother   . Arthritis Mother   . Colon polyps Mother   . Diabetes Father   . Hypertension Father   . Colon polyps Father   . Diabetes Maternal Grandmother   . COPD Maternal Grandmother   . Hypertension Maternal Grandmother   . Ovarian cysts Maternal Grandmother   . Arthritis Maternal Grandmother   . Hypertension Maternal Grandfather   . Hypertension Paternal Grandmother   . Lung cancer Paternal Grandmother   . Hypertension Paternal Grandfather   . Stomach cancer Paternal Grandfather   . Muscular dystrophy Cousin   . Diabetes Paternal Aunt   . Anesthesia problems Neg Hx   . Depression Neg Hx    Social History  Substance Use Topics  . Smoking status: Never Smoker   . Smokeless tobacco: Never Used  . Alcohol Use: No   Current Outpatient Prescriptions  Medication Sig Dispense Refill  . albuterol (PROVENTIL) (2.5 MG/3ML) 0.083% nebulizer solution Take 3 mLs (2.5 mg total) by nebulization every 6 (six) hours as needed for wheezing or shortness of breath. 75 mL 2  . Albuterol Sulfate (PROAIR RESPICLICK) 108 (90 BASE) MCG/ACT AEPB Inhale 2 puffs into the lungs every  4 (four) hours as needed. 1 each 1  . budesonide-formoterol (SYMBICORT) 160-4.5 MCG/ACT inhaler Inhale 2 puffs into the lungs 2 (two) times daily. 1 Inhaler 2   No current facility-administered medications for this visit.   No Known Allergies   Review of Systems: Gen: Denies any fever, chills, sweats, anorexia, fatigue, weakness, malaise, weight loss, and sleep disorder CV: Denies chest pain, angina, palpitations, syncope, orthopnea, PND, peripheral edema, and claudication. Resp: Denies dyspnea at rest, dyspnea with exercise, cough, sputum, wheezing, coughing up blood, and pleurisy. GI: Denies vomiting blood, jaundice, and fecal incontinence.   Denies dysphagia or odynophagia. GU  : Denies urinary burning, blood in urine, urinary frequency, urinary hesitancy, nocturnal urination, and urinary incontinence. MS: Denies joint pain, limitation of movement, and swelling, stiffness, low back pain, extremity pain. Denies muscle weakness, cramps, atrophy.  Derm: Denies rash, itching, dry skin, hives, moles, warts, or unhealing ulcers.  Psych: Denies depression, anxiety, memory loss, suicidal ideation, hallucinations, paranoia, and confusion. Heme: Denies bruising, bleeding, and enlarged lymph nodes. Neuro:  Denies any headaches, dizziness, paresthesia Endo:  Denies any problems with DM, thyroid, adrenal    Physical Exam: BP 116/70 mmHg  Pulse 64  Ht 5\' 5"  (1.651 m)  Wt 140 lb (63.504 kg)  BMI 23.30 kg/m2 General: Pleasant, well developed , Caucasian female in no acute distress Head: Normocephalic and atraumatic Eyes:  sclerae anicteric, conjunctiva pink  Ears: Normal auditory acuity Lungs: Clear throughout to auscultation Heart: Regular rate and rhythm Abdomen: Soft, non distended, non-tender. No masses, no hepatomegaly. Normal bowel sounds Rectal: External hemorrhoid and skin tag noted. There is pain perianal excoriation. Anoscopy with internal hemorrhoids. No fissure noted. Musculoskeletal: Symmetrical with no gross deformities  Extremities: No edema  Neurological: Alert oriented x 4, grossly nonfocal Psychological:  Alert and cooperative. Normal mood and affect  Assessment and Recommendations: 34 year old female presenting with a several month history of rectal discomfort. Her discomfort is likely due to her internal and sternal hemorrhoids. She's been advised to keep her stools soft. She will use Tucks wipes as needed. She will be given a trial of Anusol HC suppositories 1 per rectum twice daily for 10 days. She will also try calmoseptine  ointment to the perirectal area 2-3 times daily as needed. She will call in 2 weeks if she has no relief with this regimen,  otherwise she will follow up as needed.        Anita Mcadory, Moise BoringLori P PA-C 02/17/2015,

## 2015-02-17 NOTE — Patient Instructions (Signed)
We have sent the following medications to your pharmacy for you to pick up at your convenience: Anusol and Calmoepture  We have given you information on hemorrhoids to read and review.  Please call in 2 weeks if no improvement.     Hemorrhoids are swollen veins around the rectum or anus. There are two types of hemorrhoids:   Internal hemorrhoids. These occur in the veins just inside the rectum. They may poke through to the outside and become irritated and painful.  External hemorrhoids. These occur in the veins outside the anus and can be felt as a painful swelling or hard lump near the anus. CAUSES  Pregnancy.   Obesity.   Constipation or diarrhea.   Straining to have a bowel movement.   Sitting for long periods on the toilet.  Heavy lifting or other activity that caused you to strain.  Anal intercourse. SYMPTOMS   Pain.   Anal itching or irritation.   Rectal bleeding.   Fecal leakage.   Anal swelling.   One or more lumps around the anus.  DIAGNOSIS  Your caregiver may be able to diagnose hemorrhoids by visual examination. Other examinations or tests that may be performed include:   Examination of the rectal area with a gloved hand (digital rectal exam).   Examination of anal canal using a small tube (scope).   A blood test if you have lost a significant amount of blood.  A test to look inside the colon (sigmoidoscopy or colonoscopy). TREATMENT Most hemorrhoids can be treated at home. However, if symptoms do not seem to be getting better or if you have a lot of rectal bleeding, your caregiver may perform a procedure to help make the hemorrhoids get smaller or remove them completely. Possible treatments include:   Placing a rubber band at the base of the hemorrhoid to cut off the circulation (rubber band ligation).   Injecting a chemical to shrink the hemorrhoid (sclerotherapy).   Using a tool to burn the hemorrhoid (infrared light therapy).    Surgically removing the hemorrhoid (hemorrhoidectomy).   Stapling the hemorrhoid to block blood flow to the tissue (hemorrhoid stapling).  HOME CARE INSTRUCTIONS   Eat foods with fiber, such as whole grains, beans, nuts, fruits, and vegetables. Ask your doctor about taking products with added fiber in them (fibersupplements).  Increase fluid intake. Drink enough water and fluids to keep your urine clear or pale yellow.   Exercise regularly.   Go to the bathroom when you have the urge to have a bowel movement. Do not wait.   Avoid straining to have bowel movements.   Keep the anal area dry and clean. Use wet toilet paper or moist towelettes after a bowel movement.   Medicated creams and suppositories may be used or applied as directed.   Only take over-the-counter or prescription medicines as directed by your caregiver.   Take warm sitz baths for 15-20 minutes, 3-4 times a day to ease pain and discomfort.   Place ice packs on the hemorrhoids if they are tender and swollen. Using ice packs between sitz baths may be helpful.   Put ice in a plastic bag.   Place a towel between your skin and the bag.   Leave the ice on for 15-20 minutes, 3-4 times a day.   Do not use a donut-shaped pillow or sit on the toilet for long periods. This increases blood pooling and pain.  SEEK MEDICAL CARE IF:  You have increasing pain and swelling that  is not controlled by treatment or medicine.  You have uncontrolled bleeding.  You have difficulty or you are unable to have a bowel movement.  You have pain or inflammation outside the area of the hemorrhoids. MAKE SURE YOU:  Understand these instructions.  Will watch your condition.  Will get help right away if you are not doing well or get worse.   This information is not intended to replace advice given to you by your health care provider. Make sure you discuss any questions you have with your health care provider.    Document Released: 03/04/2000 Document Revised: 02/22/2012 Document Reviewed: 01/10/2012 Elsevier Interactive Patient Education [employer]

## 2015-02-18 NOTE — Progress Notes (Signed)
Agree w/ Ms. Hvozdovic's note and mangement.  

## 2015-08-18 ENCOUNTER — Ambulatory Visit (INDEPENDENT_AMBULATORY_CARE_PROVIDER_SITE_OTHER): Payer: Managed Care, Other (non HMO) | Admitting: Family Medicine

## 2015-08-18 ENCOUNTER — Encounter: Payer: Self-pay | Admitting: Family Medicine

## 2015-08-18 VITALS — BP 120/80 | HR 89 | Ht 65.0 in | Wt 149.0 lb

## 2015-08-18 DIAGNOSIS — R109 Unspecified abdominal pain: Secondary | ICD-10-CM

## 2015-08-18 DIAGNOSIS — Z87442 Personal history of urinary calculi: Secondary | ICD-10-CM

## 2015-08-18 MED ORDER — TAMSULOSIN HCL 0.4 MG PO CAPS: 0.4000 mg | ORAL_CAPSULE | Freq: Every day | ORAL | Status: DC

## 2015-08-18 MED ORDER — HYDROCODONE-ACETAMINOPHEN 7.5-325 MG/15ML PO SOLN: 10.0000 mL | Freq: Four times a day (QID) | ORAL | Status: DC | PRN

## 2015-08-18 NOTE — Progress Notes (Signed)
   Subjective:    Patient ID: Pryor OchoaBrooke Verrastro, female    DOB: 1980/04/07, 35 y.o.   MRN: [id number]  HPI this last Sunday she developed right flank and abdominal pain that has been intermittent in nature lasting usually to 3 hours with associated nausea and vomiting. She has used some ibuprofen for this fluid is not obtained great relief. She has a previous history of renal stones. She states this pain is similar to the one she had in the past. Presently she is not in any pain.  Review of Systems     Objective:   Physical Exam Alert and in no distress otherwise not examined. Urine microscopic was positive for red cells.       Assessment & Plan:  Right flank pain - Plan: HYDROcodone-acetaminophen (HYCET) 7.5-325 mg/15 ml solution, tamsulosin (FLOMAX) 0.4 MG CAPS capsule  History of renal stone Her symptoms are very consistent with kidney stone. I will give her Flomax as well as pain medication. If this does not improve, I discussed possible x-rays and referral. She is comfortable with the present approach.

## 2015-08-19 ENCOUNTER — Other Ambulatory Visit: Payer: Self-pay

## 2015-08-19 ENCOUNTER — Encounter: Payer: Self-pay | Admitting: Family Medicine

## 2015-08-19 MED ORDER — ALPRAZOLAM 0.25 MG PO TABS: 0.2500 mg | ORAL_TABLET | Freq: Two times a day (BID) | ORAL | Status: DC | PRN

## 2015-08-24 ENCOUNTER — Encounter: Payer: Self-pay | Admitting: Family Medicine

## 2015-08-25 ENCOUNTER — Ambulatory Visit
Admission: RE | Admit: 2015-08-25 | Discharge: 2015-08-25 | Disposition: A | Discharge status: Home / Self Care | Payer: Managed Care, Other (non HMO) | Source: Ambulatory Visit | Attending: Family Medicine | Admitting: Family Medicine

## 2015-08-25 ENCOUNTER — Ambulatory Visit (INDEPENDENT_AMBULATORY_CARE_PROVIDER_SITE_OTHER): Payer: Managed Care, Other (non HMO) | Admitting: Family Medicine

## 2015-08-25 ENCOUNTER — Encounter: Payer: Self-pay | Admitting: Family Medicine

## 2015-08-25 DIAGNOSIS — R109 Unspecified abdominal pain: Secondary | ICD-10-CM

## 2015-08-25 DIAGNOSIS — R10A0 Flank pain, unspecified side: Secondary | ICD-10-CM

## 2015-08-25 DIAGNOSIS — R112 Nausea with vomiting, unspecified: Secondary | ICD-10-CM

## 2015-08-25 DIAGNOSIS — Z87442 Personal history of urinary calculi: Secondary | ICD-10-CM | POA: Diagnosis not present

## 2015-08-25 LAB — POCT URINALYSIS DIPSTICK
Bilirubin, UA: NEGATIVE
GLUCOSE UA: NEGATIVE
Ketones, UA: NEGATIVE
LEUKOCYTES UA: NEGATIVE
NITRITE UA: NEGATIVE
Protein, UA: NEGATIVE
RBC UA: NEGATIVE
Spec Grav, UA: 1.015
UROBILINOGEN UA: NEGATIVE
pH, UA: 6.5

## 2015-08-25 LAB — CBC WITH DIFFERENTIAL/PLATELET
BASOS PCT: 0 %
Basophils Absolute: 0 cells/uL (ref 0–200)
Eosinophils Absolute: 0 cells/uL — ABNORMAL LOW (ref 15–500)
Eosinophils Relative: 0 %
HEMATOCRIT: 40.2 % (ref 35.0–45.0)
Hemoglobin: 14.2 g/dL (ref 11.7–15.5)
LYMPHS PCT: 11 %
Lymphs Abs: 781 cells/uL — ABNORMAL LOW (ref 850–3900)
MCH: 31.5 pg (ref 27.0–33.0)
MCHC: 35.3 g/dL (ref 32.0–36.0)
MCV: 89.1 fL (ref 80.0–100.0)
MONO ABS: 781 {cells}/uL (ref 200–950)
MPV: 11.1 fL (ref 7.5–12.5)
Monocytes Relative: 11 %
NEUTROS ABS: 5538 {cells}/uL (ref 1500–7800)
Neutrophils Relative %: 78 %
PLATELETS: 190 10*3/uL (ref 140–400)
RBC: 4.51 MIL/uL (ref 3.80–5.10)
RDW: 12.3 % (ref 11.0–15.0)
WBC: 7.1 10*3/uL (ref 4.0–10.5)

## 2015-08-25 LAB — BASIC METABOLIC PANEL
BUN: 8 mg/dL (ref 7–25)
CHLORIDE: 104 mmol/L (ref 98–110)
CO2: 22 mmol/L (ref 20–31)
Calcium: 9.1 mg/dL (ref 8.6–10.2)
Creat: 0.66 mg/dL (ref 0.50–1.10)
GLUCOSE: 80 mg/dL (ref 65–99)
POTASSIUM: 3.9 mmol/L (ref 3.5–5.3)
Sodium: 137 mmol/L (ref 135–146)

## 2015-08-25 MED ORDER — KETOROLAC TROMETHAMINE 60 MG/2ML IM SOLN
60.0000 mg | Freq: Once | INTRAMUSCULAR | Status: AC
  Administered 2015-08-25: 60 mg via INTRAMUSCULAR

## 2015-08-25 NOTE — Progress Notes (Signed)
   Subjective:    Patient ID: Andrea Boyer, female    DOB: June 15, 1980, 35 y.o.   MRN: [id number]  HPI She is here for a recheck. She continues to have flank and abdominal pain. She now states that she is having more right CVA tenderness then in the lower abdomen. He also complains of fever and chills with nausea and occasional vomiting.   Review of Systems     Objective:   Physical Exam Final signs are recorded. Abdominal exam shows decreased bowel sounds without masses or tenderness.       Assessment & Plan:  Flank pain - Plan: POCT Urinalysis Dipstick, CBC with Differential/Platelet, Basic Metabolic Panel, CT Abdomen Pelvis Wo Contrast  History of renal stone - Plan: CBC with Differential/Platelet, Basic Metabolic Panel, CT Abdomen Pelvis Wo Contrast  Non-intractable vomiting with nausea, vomiting of unspecified type She did obtain relief with the use of the Toradol. I explained the results to her at 5:30 tonight. She is to call back in the morning the last know how she is doing. Might need another evaluation and to look for another cause for the flank pain.

## 2015-08-26 ENCOUNTER — Encounter: Payer: Self-pay | Admitting: Family Medicine

## 2015-08-26 ENCOUNTER — Telehealth: Payer: Self-pay | Admitting: Family Medicine

## 2015-08-26 ENCOUNTER — Ambulatory Visit: Payer: Managed Care, Other (non HMO) | Admitting: Medical

## 2015-08-26 NOTE — Telephone Encounter (Signed)
Left message for pt to call. Needs an appt today with Vincenza HewsShane if possible.

## 2015-08-26 NOTE — Telephone Encounter (Signed)
Pt called stating that she is on amoxicillian for an infected tooth and she hopes that it will help with throat. Pt thought that was the reason that Dr Susann GivensLalonde wanted her to be seen today however Dr Susann GivensLalonde confirmed that she was to be seen regarding her unresolved flank pain. Pt said her back feels the best it has felt in several weeks. She slept well because she did not have much back pain last night. So pt does not want to come in for another appt right now. She will wait to see if she continues to feel better and continue to improve and if not she will make another appt.

## 2015-09-30 ENCOUNTER — Encounter: Payer: Self-pay | Admitting: Family Medicine

## 2015-10-01 ENCOUNTER — Encounter: Payer: Self-pay | Admitting: Family Medicine

## 2015-10-01 ENCOUNTER — Ambulatory Visit (INDEPENDENT_AMBULATORY_CARE_PROVIDER_SITE_OTHER): Payer: Managed Care, Other (non HMO) | Admitting: Family Medicine

## 2015-10-01 VITALS — BP 122/80 | HR 84 | Temp 99.4°F | Ht 65.0 in | Wt 152.0 lb

## 2015-10-01 DIAGNOSIS — R5383 Other fatigue: Secondary | ICD-10-CM | POA: Diagnosis not present

## 2015-10-01 DIAGNOSIS — M25561 Pain in right knee: Secondary | ICD-10-CM

## 2015-10-01 DIAGNOSIS — G44229 Chronic tension-type headache, not intractable: Secondary | ICD-10-CM

## 2015-10-01 DIAGNOSIS — M25562 Pain in left knee: Secondary | ICD-10-CM

## 2015-10-01 LAB — TSH: TSH: 1.8 m[IU]/L

## 2015-10-01 NOTE — Progress Notes (Signed)
Chief Complaint  Patient presents with  . Advice Only    has been extremely fatigued over the last 3 months. Just feels like she is not the person she used to be. Wakes up tired, feels like she cannot make it through the day at times, takes 3 hour naps. Has HA's almost every day.    Gradually worsening fatigue x 3 months.   She reports sleeping well, at least 7 hours/night. Occasional will wake up in the night, falls back to sleep easily. No known snoring. Usually feels refreshed in the mornings. She recently started taking naps on days off--intends x 15 mins, but then might sleep x 3 hours. Denies any allergy symptoms or congestion. Exercise--walks the dog in the evenings x 30 minutes on average. She tries to do video of exercises, not but lately.  Fatigue--causes her to be less active, not get as much done. Headaches daily, posteriorly, sometimes "whole head".  Sometimes she wakes up with it, other times it develops later in the day.  Has Mirena x 4 years, so no regular cycles.  Anxiety--uses xanax about once a week.  Recalls being on something preventative a long time ago, can't recall what it was.  Mother with lupus--asking if she has been screened 11/2013--negative ANA, ESR 4  She complains of a lot of pain--knee pain and back pain. "I never feel good anymore" "I hurt and ache all over"--later determined mainly to be neck/shoulders and knees, not necessarily diffuse, widespread pain. "I stay swollen"--hands are swollen x 2 years, not just related to the recent heat. Rings intermittently are tight (not today)  She was seen in June with kidney stone (per pt) and some labs checked then. (CT at that time was normal).  Lab Results  Component Value Date   WBC 7.1 08/25/2015   HGB 14.2 08/25/2015   HCT 40.2 08/25/2015   MCV 89.1 08/25/2015   PLT 190 08/25/2015   PMH, PSH, SH reviewed.  Outpatient Encounter Prescriptions as of 10/01/2015  Medication Sig Note  . albuterol  (PROVENTIL) (2.5 MG/3ML) 0.083% nebulizer solution Take 3 mLs (2.5 mg total) by nebulization every 6 (six) hours as needed for wheezing or shortness of breath.   . Albuterol Sulfate (PROAIR RESPICLICK) 353 (90 BASE) MCG/ACT AEPB Inhale 2 puffs into the lungs every 4 (four) hours as needed.   . ALPRAZolam (XANAX) 0.25 MG tablet Take 1 tablet (0.25 mg total) by mouth 2 (two) times daily as needed for anxiety. 10/01/2015: Uses about once a week, as needed for anxiety  . budesonide-formoterol (SYMBICORT) 160-4.5 MCG/ACT inhaler Inhale 2 puffs into the lungs 2 (two) times daily.   . Menthol-Zinc Oxide 0.44-20.625 % OINT Apply to perianal area two-three times a day   . HYDROcodone-acetaminophen (HYCET) 7.5-325 mg/15 ml solution Take 10 mLs by mouth 4 (four) times daily as needed for moderate pain. (Patient not taking: Reported on 10/01/2015)   . hydrocortisone (ANUSOL-HC) 25 MG suppository Place 1 suppository (25 mg total) rectally every 12 (twelve) hours. (Patient not taking: Reported on 08/18/2015)   . tamsulosin (FLOMAX) 0.4 MG CAPS capsule Take 1 capsule (0.4 mg total) by mouth daily. (Patient not taking: Reported on 10/01/2015)    No facility-administered encounter medications on file as of 10/01/2015.   No Known Allergies  ROS: Denies fevers, nausea, vomiting, diarrhea.  No menses due to Mirena. She is more hot than cold. Denies chest pain, palpitations, changes to skin, bowels.  Some hair thinning.  No runny nose, sinus pain,  ear pain.  Has some chest heaviness, which she relates to anxiety.  Slight rattle first deep breath of the day. Some mild chronic constipation, unchanged Mostly bilateral knee pain, calves, also her shoulders No urinary complaints, bleeding, bruising, rash  PHYSICAL EXAM: BP 122/80 mmHg  Pulse 84  Temp(Src) 99.4 F (37.4 C) (Tympanic)  Ht _0  (1.651 m)  Wt 152 lb (68.947 kg)  BMI 25.29 kg/m2  Well developed, well appearing female. Talkative, with mainly complaints,  often changing topics during a line of questioning, bringing in new complaints/concerns. She does not appear particularly tired, is in no distress HEENT: PERRL, EOMI, conjunctiva and sclera are clear. Nasal mucosa normal. OP is clear.  Neck: no lymphadenopathy, thyromegaly or mass. No spinal tenderness.  Tender at paraspinous muscles bilaterally in the neck Heart: regular rate and rhythm Lungs: clear bilaterally Abdomen: soft, nontender Extremities: no edema, normal pulses. Knees without effusions (clothing today doesn't allow for full knee exam) Skin: normal turgor, no rashes Psych: slightly anxious (many complaints, related back to health concerns about mother); good eye contact, normal speech. Normal hygiene and grooming Neuro: alert and oriented, cranial nerves intact, normal gait  ASSESSMENT/PLAN:  Other fatigue - Plan: VITAMIN D 25 Hydroxy (Vit-D Deficiency, Fractures), TSH  Chronic tension-type headache, not intractable - posterior headaches, muscular. posture, heat, stretches  Arthralgia of both knees - normal ANA and ESR in past; f/u with Dr. Redmond School for full exam if persists. Discussed exercise, shoes, tylenol prn   TSH, VItamin D Ddx of fatigue reviewed in detail Neck stretches shown.  25-30 min visit, more than 1/2 spent counseling.    Do the neck exercises as shown 2-3 times daily; use heat, stretches and message. Try and watch your posture. Consider changing to a contoured pillow if waking up with neck pain. Try and avoid naps.  Continue to get at least 7-8 hours of sleep each night (avoid getting more than 10 hours/night).  We will contact you with your test results in 1-2 days. If there is no vitamin D deficiency and if thyroid is normal, schedule a follow up appointment with Dr. Redmond School or Audelia Acton to discuss your knee pain. Consider a medication like Cymbalta if you find that your anxiety isn't really well controlled.

## 2015-10-01 NOTE — Patient Instructions (Signed)
  Do the neck exercises as shown 2-3 times daily; use heat, stretches and message. Try and watch your posture. Consider changing to a contoured pillow if waking up with neck pain. Try and avoid naps.  Continue to get at least 7-8 hours of sleep each night (avoid getting more than 10 hours/night).  We will contact you with your test results in 1-2 days. If there is no vitamin D deficiency and if thyroid is normal, schedule a follow up appointment with Dr. Susann GivensLalonde or Vincenza HewsShane to discuss your knee pain. Consider a medication like Cymbalta if you find that your anxiety isn't really well controlled.

## 2015-10-02 LAB — VITAMIN D 25 HYDROXY (VIT D DEFICIENCY, FRACTURES): VIT D 25 HYDROXY: 40 ng/mL (ref 30–100)

## 2015-10-05 ENCOUNTER — Ambulatory Visit (INDEPENDENT_AMBULATORY_CARE_PROVIDER_SITE_OTHER): Payer: Managed Care, Other (non HMO) | Admitting: Family Medicine

## 2015-10-05 ENCOUNTER — Encounter: Payer: Self-pay | Admitting: Family Medicine

## 2015-10-05 VITALS — BP 114/70 | HR 93 | Temp 98.2°F | Wt 150.0 lb

## 2015-10-05 DIAGNOSIS — R1011 Right upper quadrant pain: Secondary | ICD-10-CM

## 2015-10-05 MED ORDER — HYDROCODONE-ACETAMINOPHEN 5-325 MG PO TABS: 1.0000 | ORAL_TABLET | Freq: Four times a day (QID) | ORAL | Status: DC | PRN

## 2015-10-05 NOTE — Patient Instructions (Signed)
Avoid greasy foods and pay attention to any other foods that might make it worse

## 2015-10-05 NOTE — Progress Notes (Signed)
   Subjective:    Patient ID: Pryor OchoaBrooke Rosales, female    DOB: Mar 02, 1981, 35 y.o.   MRN: [id number]  HPI She is here for evaluation of intermittent right upper quadrant pain. She was evaluated in June for right flank pain due to previous history of renal stones. She now states that the pain is getting worse. Further questioning now indicates that usually 30 minutes after eating she will notice bloating and then some diarrhea as well as continued right upper quadrant pain. She cannot relate this to any particular foods. She does have 1 child at home. She has an IUD in. She does not smoke. No fever, chills, vomiting.   Review of Systems     Objective:   Physical Exam Alert and in no distress. Cardiac exam shows regular rhythm without murmurs or gallop. Lungs are clear to auscultation. Abdominal exam shows active bowel sounds with tenderness in the right upper quadrant. Positive Murphy's sign and Murphy's punch. Evaluation of the previous CTs showed no evidence of stones.       Assessment & Plan:  Abdominal pain, right upper quadrant - Plan: HYDROcodone-acetaminophen (NORCO) 5-325 MG tablet, NM Hepato W/Eject Fract, CANCELED: NM HEPATOBILIARY INCLUDING GB  I explained that now her symptoms are more consistent with gallbladder disease but no evidence of stones. I will get a HIDA scan and follow-up pending results of this.

## 2015-10-08 ENCOUNTER — Ambulatory Visit: Payer: Managed Care, Other (non HMO) | Admitting: Family Medicine

## 2015-10-12 ENCOUNTER — Ambulatory Visit (HOSPITAL_COMMUNITY)
Admission: RE | Admit: 2015-10-12 | Discharge: 2015-10-12 | Disposition: A | Discharge status: Home / Self Care | Payer: Managed Care, Other (non HMO) | Source: Ambulatory Visit | Attending: Family Medicine | Admitting: Family Medicine

## 2015-10-12 DIAGNOSIS — R1011 Right upper quadrant pain: Secondary | ICD-10-CM | POA: Insufficient documentation

## 2015-10-12 MED ORDER — TECHNETIUM TC 99M MEBROFENIN IV KIT
5.0000 | PACK | Freq: Once | INTRAVENOUS | Status: AC | PRN
  Administered 2015-10-12: 5 via INTRAVENOUS

## 2016-03-15 ENCOUNTER — Encounter: Payer: Self-pay | Admitting: Medical

## 2016-03-16 ENCOUNTER — Telehealth: Payer: Self-pay | Admitting: Medical

## 2016-03-16 ENCOUNTER — Encounter: Payer: Self-pay | Admitting: Medical

## 2016-03-16 ENCOUNTER — Ambulatory Visit (INDEPENDENT_AMBULATORY_CARE_PROVIDER_SITE_OTHER): Payer: Managed Care, Other (non HMO) | Admitting: Medical

## 2016-03-16 VITALS — BP 110/68 | HR 102 | Wt 156.2 lb

## 2016-03-16 DIAGNOSIS — J4542 Moderate persistent asthma with status asthmaticus: Secondary | ICD-10-CM

## 2016-03-16 DIAGNOSIS — J309 Allergic rhinitis, unspecified: Secondary | ICD-10-CM

## 2016-03-16 DIAGNOSIS — F419 Anxiety disorder, unspecified: Secondary | ICD-10-CM | POA: Diagnosis not present

## 2016-03-16 DIAGNOSIS — R0602 Shortness of breath: Secondary | ICD-10-CM | POA: Diagnosis not present

## 2016-03-16 DIAGNOSIS — J4540 Moderate persistent asthma, uncomplicated: Secondary | ICD-10-CM | POA: Insufficient documentation

## 2016-03-16 MED ORDER — MONTELUKAST SODIUM 10 MG PO TABS: 10.0000 mg | ORAL_TABLET | Freq: Every day | ORAL | 0 refills | Status: DC

## 2016-03-16 MED ORDER — BUDESONIDE-FORMOTEROL FUMARATE 160-4.5 MCG/ACT IN AERO: 2.0000 | INHALATION_SPRAY | Freq: Two times a day (BID) | RESPIRATORY_TRACT | 11 refills | Status: DC

## 2016-03-16 MED ORDER — IPRATROPIUM-ALBUTEROL 0.5-2.5 (3) MG/3ML IN SOLN: 3.0000 mL | Freq: Four times a day (QID) | RESPIRATORY_TRACT | Status: DC

## 2016-03-16 MED ORDER — ALBUTEROL SULFATE (2.5 MG/3ML) 0.083% IN NEBU
2.5000 mg | INHALATION_SOLUTION | Freq: Four times a day (QID) | RESPIRATORY_TRACT | 1 refills | Status: DC | PRN
Start: 2016-03-16 — End: 2018-06-28

## 2016-03-16 MED ORDER — ALPRAZOLAM 0.25 MG PO TABS: 0.2500 mg | ORAL_TABLET | Freq: Two times a day (BID) | ORAL | 1 refills | Status: DC | PRN

## 2016-03-16 MED ORDER — PREDNISONE 10 MG PO TABS: ORAL_TABLET | ORAL | 0 refills | Status: DC

## 2016-03-16 MED ORDER — ALBUTEROL SULFATE 108 (90 BASE) MCG/ACT IN AEPB
2.0000 | INHALATION_SPRAY | RESPIRATORY_TRACT | 2 refills | Status: DC | PRN
Start: 2016-03-16 — End: 2017-10-20

## 2016-03-16 MED ORDER — CETIRIZINE HCL 10 MG PO TABS: 10.0000 mg | ORAL_TABLET | Freq: Every day | ORAL | 0 refills | Status: DC

## 2016-03-16 NOTE — Progress Notes (Signed)
Subjective: Chief Complaint  Patient presents with  . coughing , wheezing    coughing wheezing ,congestion    Here for f/u on asthma, anxiety.  Needs refills.  However, she notes asthma flare in the last few days, likely triggered by cold weather.  She has hx/o asthma beginning in her teens.   Lifetime non smoker, no current tobacco exposure.  In the past season change, seasonal allergies and cold temperature can flare her asthma.     She notes in recent months using Symbicort daily but also albuterol daily.   However, has been wheezing and SOB the last 2-3 days.   Doesn't feel sick, no body aches, fever, congestion, sneezing, watery eyes, ear pain, sore throat.   No sick contacts.     Using albuterol nebs twice daily without relief .    She has a dog.   No prior allergy testing. Last PFT over a year ago.   Son has asthma bad  No leg edema, no chest pain or prior heart issues.  No recent long travel, no recent surgery or procedure, no hx/o DVT/ PE.   No calve pain or swelling.  No other aggravating or relieving factors. No other complaint.  Past Medical History:  Diagnosis Date  . Anxiety    history - no current problems  . Asthma    exercise induced rarely uses inhaler  . Elevated prolactin level (HCC) 10/2006   NEGATIVE MRI  . History of kidney stones    passes stones - no surgery required  . IBS (irritable bowel syndrome)   . PIH (pregnancy induced hypertension) 06/2011   PIH resolved after delivery   . Pneumonia 2010  . PONV (postoperative nausea and vomiting)   . SVD (spontaneous vaginal delivery)    x 1   No current outpatient prescriptions on file prior to visit.   No current facility-administered medications on file prior to visit.     Objective; BP 110/68   Pulse (!) 102   Wt 156 lb 3.2 oz (70.9 kg)   SpO2 97%   BMI 25.99 kg/m   General appearance: alert, WD/WN, dyspneic HEENT: normocephalic, sclerae anicteric, TMs pearly, nares patent, no discharge or  erythema, pharynx normal Oral cavity: MMM, no lesions Neck: supple, no lymphadenopathy, no thyromegaly, no masses Heart: mildly tachycardic, otherwise RRR, normal S1, S2, no murmurs Lungs: scattered wheezes, no rhonchi, or rales, no dullness  No ext edema Pulses: 2+ symmetric, upper and lower extremities, normal cap refill No calve tenderness or swelling, negative homans    Assessment: Encounter Diagnoses  Name Primary?  . Moderate persistent asthma with status asthmaticus Yes  . Allergic rhinitis, unspecified chronicity, unspecified seasonality, unspecified trigger   . Breath shortness   . Anxiety     Plan: After initial triage, gave round of duoneb with some improvement.  discussed causes of SOB, but asthma flare seems most likely.   Begin course of prednisone, increase albuterol use this next several days.    In general for better asthma control, begin Singulair, zyrtec, c/T Symbicort, c/t alubgteorl prn.  Discussed difference between rescue and preventative inhaler.   If improving in the coming days, then recheck in 2-3 wk.  If no improvement in 48 hours, then recheck .   Refilled xanax for prn use for anxiety/panic feeling.  Last refill was limited script in 07/2015.   Andrea Boyer was seen today for coughing , wheezing.  Diagnoses and all orders for this visit:  Moderate persistent asthma with status  asthmaticus  Allergic rhinitis, unspecified chronicity, unspecified seasonality, unspecified trigger  Breath shortness -     ipratropium-albuterol (DUONEB) 0.5-2.5 (3) MG/3ML nebulizer solution 3 mL; Take 3 mLs by nebulization every 6 (six) hours.  Anxiety  Other orders -     montelukast (SINGULAIR) 10 MG tablet; Take 1 tablet (10 mg total) by mouth at bedtime. -     cetirizine (ZYRTEC) 10 MG tablet; Take 1 tablet (10 mg total) by mouth daily. -     budesonide-formoterol (SYMBICORT) 160-4.5 MCG/ACT inhaler; Inhale 2 puffs into the lungs 2 (two) times daily. -     albuterol  (PROVENTIL) (2.5 MG/3ML) 0.083% nebulizer solution; Take 3 mLs (2.5 mg total) by nebulization every 6 (six) hours as needed for wheezing or shortness of breath. -     Albuterol Sulfate (PROAIR RESPICLICK) 108 (90 Base) MCG/ACT AEPB; Inhale 2 puffs into the lungs every 4 (four) hours as needed. -     ALPRAZolam (XANAX) 0.25 MG tablet; Take 1 tablet (0.25 mg total) by mouth 2 (two) times daily as needed for anxiety. -     predniSONE (DELTASONE) 10 MG tablet; 6/5/4/3/2/1

## 2016-03-16 NOTE — Telephone Encounter (Signed)
Call back about questions to clarify.   Has she had recent surgery, procedure, leg swelling, calve pain?  Any history of blood clot, pulmonary embolism?    Verify whether she is on birth control?   Make sure she starts the prednisone, and see if feeling any better this morning?

## 2016-03-16 NOTE — Patient Instructions (Signed)
Your asthma is not under good control and you appear to have a flare currently   Short term begin Prednisone oral steroid medication Increase your albuterol to every 4- 6 hours for the next several days until much improved  For asthma maintenance  Begin Cetirizine allergy pill at bedtime  Begin Singulair allergy pill at bedtime  continue Symbicort 2 puffs twice daily (preventative inhaler/maintenance inhaler)  Continue Albuterol Rescue/emergency inhaler every 4-6 hours as needed  Avoid being outside in really cold weather if possible  avoid smoke and other fumes  Plan to recheck in 3-4 weeks.

## 2016-03-17 NOTE — Telephone Encounter (Signed)
She has not had any swelling in her leg and no hx of P E , or DVT, and she is using mirena   iud  For her birth control. She said that  She is taking the prednisone  And  She stayed home to take, she is getting better.

## 2016-03-17 NOTE — Telephone Encounter (Signed)
Ok, good!

## 2016-03-21 NOTE — L&D Delivery Note (Addendum)
Delivery Note Pt pushed for 30mins then labored down for an hour due to nausea/vomiting and headache for an hour. She then resumed pushing for about an hour and at 6:57 PM a viable female was delivered via Vaginal, Spontaneous Delivery (Presentation: OP;  ).  APGAR:7,9 , ; weight  pending.   Placenta status:delivered, intact, shultz , .  Cord:3vc  with the following complications: body card x 1.  Cord pH:n/a   Anesthesia:  Epidural Episiotomy: None Lacerations: None Suture Repair: n/a Est. Blood Loss (mL): 100  Mom to postpartum.  Baby to Couplet care / Skin to Skin  Desires circumcision in hospital - discussed.  Andrea Boyer 11/19/2016, 7:12 PM

## 2016-03-23 ENCOUNTER — Encounter: Payer: Self-pay | Admitting: Medical

## 2016-03-24 ENCOUNTER — Telehealth: Payer: Self-pay | Admitting: Medical

## 2016-03-24 DIAGNOSIS — R0602 Shortness of breath: Secondary | ICD-10-CM

## 2016-03-24 DIAGNOSIS — R062 Wheezing: Secondary | ICD-10-CM

## 2016-03-24 NOTE — Telephone Encounter (Signed)
Called to get Andrea Boyer  For ct scan  And her insurance Has termed with [facility] ,called and l/m to pt about this . For her to call me back.

## 2016-03-24 NOTE — Telephone Encounter (Signed)
Set her up for chest CT to rule out pulmonary embolism due to wheezing, shortness of breath

## 2016-03-24 NOTE — Telephone Encounter (Signed)
Please set her up for chest CT to rule out clot.

## 2016-03-25 NOTE — Telephone Encounter (Signed)
Get status update, any improvement today?  Another option vs CT would be a blood test called d-dimer.  This is a screen for blood clot, not 100% accurate, but may be a cheaper alternative.   Can check with  [place] or front office about costs for this

## 2016-03-28 ENCOUNTER — Other Ambulatory Visit: Payer: Self-pay

## 2016-03-29 ENCOUNTER — Ambulatory Visit
Admission: RE | Admit: 2016-03-29 | Discharge: 2016-03-29 | Disposition: A | Discharge status: Home / Self Care | Payer: BLUE CROSS/BLUE SHIELD | Source: Ambulatory Visit | Attending: Medical | Admitting: Medical

## 2016-03-29 ENCOUNTER — Other Ambulatory Visit: Payer: Self-pay | Admitting: Medical

## 2016-03-29 DIAGNOSIS — R0602 Shortness of breath: Secondary | ICD-10-CM

## 2016-03-29 NOTE — Telephone Encounter (Signed)
Called to [facility] to get auth, for ct scan , because she hasn't any chest x-ray they will not approve the ct scan , called pt to let her know about this, she is going today to have the x-ray done. Once we get the results of x-ray  then we try again to get this approved.

## 2016-03-29 NOTE — Telephone Encounter (Signed)
Ok, lets get CXR to start to rule out obvious pneumonia.  The problem is that chest xray can't reveal a blood clot.  Only the CT scan can rule this out.

## 2016-03-30 NOTE — Telephone Encounter (Signed)
Called pt gave her the results of x-ray and  She is coming in to d dimmer.

## 2016-03-31 ENCOUNTER — Other Ambulatory Visit: Payer: Self-pay

## 2016-03-31 ENCOUNTER — Other Ambulatory Visit: Payer: BLUE CROSS/BLUE SHIELD

## 2016-03-31 DIAGNOSIS — R0602 Shortness of breath: Secondary | ICD-10-CM

## 2016-03-31 LAB — D-DIMER, QUANTITATIVE: D-Dimer, Quant: <0.19 mcg/mL FEU (ref <0.50)

## 2016-04-05 DIAGNOSIS — Z3201 Encounter for pregnancy test, result positive: Secondary | ICD-10-CM | POA: Diagnosis not present

## 2016-04-07 DIAGNOSIS — Z331 Pregnant state, incidental: Secondary | ICD-10-CM | POA: Diagnosis not present

## 2016-04-12 DIAGNOSIS — Z3A01 Less than 8 weeks gestation of pregnancy: Secondary | ICD-10-CM | POA: Diagnosis not present

## 2016-04-12 DIAGNOSIS — R109 Unspecified abdominal pain: Secondary | ICD-10-CM | POA: Diagnosis not present

## 2016-04-19 ENCOUNTER — Encounter: Payer: Self-pay | Admitting: Family Medicine

## 2016-04-19 ENCOUNTER — Encounter: Payer: Self-pay | Admitting: Medical

## 2016-04-19 ENCOUNTER — Ambulatory Visit (INDEPENDENT_AMBULATORY_CARE_PROVIDER_SITE_OTHER): Payer: BLUE CROSS/BLUE SHIELD | Admitting: Medical

## 2016-04-19 VITALS — BP 116/70 | HR 96 | Wt 155.8 lb

## 2016-04-19 DIAGNOSIS — J029 Acute pharyngitis, unspecified: Secondary | ICD-10-CM | POA: Diagnosis not present

## 2016-04-19 DIAGNOSIS — R52 Pain, unspecified: Secondary | ICD-10-CM | POA: Diagnosis not present

## 2016-04-19 DIAGNOSIS — Z3A01 Less than 8 weeks gestation of pregnancy: Secondary | ICD-10-CM

## 2016-04-19 DIAGNOSIS — J4540 Moderate persistent asthma, uncomplicated: Secondary | ICD-10-CM

## 2016-04-19 DIAGNOSIS — R110 Nausea: Secondary | ICD-10-CM

## 2016-04-19 DIAGNOSIS — Z3201 Encounter for pregnancy test, result positive: Secondary | ICD-10-CM | POA: Diagnosis not present

## 2016-04-19 DIAGNOSIS — R6889 Other general symptoms and signs: Secondary | ICD-10-CM

## 2016-04-19 DIAGNOSIS — N912 Amenorrhea, unspecified: Secondary | ICD-10-CM | POA: Diagnosis not present

## 2016-04-19 DIAGNOSIS — O219 Vomiting of pregnancy, unspecified: Secondary | ICD-10-CM | POA: Diagnosis not present

## 2016-04-19 LAB — POC INFLUENZA A&B (BINAX/QUICKVUE)
INFLUENZA A, POC: NEGATIVE
INFLUENZA B, POC: NEGATIVE

## 2016-04-19 LAB — POCT RAPID STREP A (OFFICE): Rapid Strep A Screen: NEGATIVE

## 2016-04-19 NOTE — Patient Instructions (Signed)
REST HYDRATE - fluids, fluids, fluids - water, gingerale, Gatorade, soup Control fever and aches with Tylenol/Acetaminophen For nausea you can use OTC Emetrol Use your nebulizer every 4-6 hours as needed If much worse in the next 48 hours such as uncontrollable nausea, unable to keep anything down, worse breathing, high fever, then get rechecked right away  Thank you for giving me the opportunity to serve you today.   Your diagnosis today includes: Encounter Diagnoses  Name Primary?  . Flu-like symptoms Yes  . Sore throat   . Body aches   . Nausea   . Moderate persistent asthma without complication   . Less than [redacted] weeks gestation of pregnancy     Medications prescribed today:   Specific home care recommendations today include:  Only take over-the-counter (OTC) or prescription medicines for pain, discomfort, or fever as directed by your caregiver.    Decongestant: You may use OTC Guaifenesin (Mucinex plain) for congestion.  You may use Pseudoephedrine (Sudafed) only if you don't have blood pressure problems or a diagnosis of hypertension.  Cough suppression: If you have cough from drainage, you may use over-the-counter Dextromethorphan (Delsym) as directed on the label  Sore throat remedies:  You may use salt water gargles, warm fluids such as coffee or hot tea, or honey/tea/lemon mixture to sooth sore throat pain.  You may use OTC sore throat remedies such as Cepacol lozenges or Chloraseptic spray for sore throat pain.  Runny nose and sneezing remedies: You may use OTC antihistamine such as Zyrtec or Benadryl, but caution as these can cause drowsiness.    Pain/fever relief: You may use over-the-counter Tylenol for pain or fever  Drink extra fluids. Fluids help thin the mucus so your sinuses can drain more easily.   Applying either moist heat or ice packs to the sinus areas may help relieve discomfort.  Use saline nasal sprays to help moisten your sinuses. The sprays can be  found at your local drugstore.    Please call or return if worse or not improving in the next few days.    Medication costs:  If you get to the pharmacy and medication prescribed today was either too expensive, not covered by your insurance, or required prior authorization, then please call us back to let us know.  We often have no way to know if a medication is too expensive or not covered by your insurance.  Thanks for your cooperation.   No Follow-up on file.    I have included other useful information below for your review.   Influenza, Adult Influenza ("the flu") is a viral infection of the respiratory tract. It causes chills, fever, cough, headache, body aches, and sore throat. Influenza in general will make you feel sicker than when you have a common cold. Symptoms of the illness typically last a few days. Cough and fatigue may continue for as long as 7 to 10 days. Influenza is highly contagious. It spreads easily to others in the droplets from coughs and sneezes. People frequently become infected by touching something that was recently contaminated with the virus and then touch their mouth, nose or eyes. This infection is caused by a virus. Symptoms will not be reduced or improved by taking an antibiotic. Antibiotics are medications that kill bacteria, not viruses. DIAGNOSIS  Diagnosis of influenza is often made based on the history and physical examination as well as the presence of influenza reports occurring in your community. Testing can be done if the diagnosis is not  certain. TREATMENT  Since influenza is caused by a virus, antibiotics are not helpful. Your caregiver may prescribe antiviral medicines to shorten the illness and lessen the severity. Your caregiver may also recommend influenza vaccination and/or antiviral medicines for your family members in order to prevent the spread of influenza to them. HOME CARE INSTRUCTIONS  DO NOT GIVE ASPIRIN TO PERSONS WITH INFLUENZA WHO ARE  UNDER 58 YEARS OF AGE. This could lead to brain and liver damage (Reye's syndrome). Read the label on over-the-counter medicines.   Stay home from work or school if at all possible until most of your symptoms are gone.   Only take over-the-counter or prescription medicines for pain, discomfort, or fever as directed by your caregiver.   Use a cool mist humidifier to increase air moisture. This will make breathing easier.   Rest until your temperature is nearly normal: 98.6 F (37 C). This usually takes 3 to 4 days. Be sure you get plenty of rest.   Drink at least eight, eight-ounce glasses of fluids per day. Fluids include water, juice, broth, gelatin, or lemonade.   Cover your mouth and nose when coughing or sneezing and wash your hands often to prevent the spread of this virus to other persons.  PREVENTION  Annual influenza vaccination (flu shots) is the best way to avoid getting influenza. An annual flu shot is now routinely recommended for all adults in the [place] SEEK MEDICAL CARE IF:   You develop shortness of breath while resting.   You have a deep cough with production of mucous or chest pain.   You develop nausea (feeling sick to your stomach), vomiting, or diarrhea.  SEEK IMMEDIATE MEDICAL CARE IF:   You have difficulty breathing, become short of breath, or your skin or nails turn bluish.   You develop severe neck pain or stiffness.   You develop a severe headache, facial pain, or earache.   You have a fever.   You develop nausea or vomiting that cannot be controlled.  Document Released: 03/04/2000 Document Revised: 11/17/2010 Document Reviewed: 01/07/2009 [facility] Patient Information 2012 [place], [place].

## 2016-04-19 NOTE — Progress Notes (Signed)
Subjective: Chief Complaint  Patient presents with  . bodyaches, chills    bodyaches,chills, headache, sore throat    Here for illness x 3 days.  Couldn't get out of bed Saturday, left work early yesterday.   Has body aches, chills, last night was worse, shook all night.  Doesn't think she had fever, but felt clammy this morning.  Has headache, sore throat, got dizzy last night.   Drinking some fluids, but does note she needs to drink more. has had nausea.   No diarrhea.   Had 1 episode of vomiting last week from morning sickness.   Still using Symbicort 2 puffs BID.  Has had some dyspnea last few days, did breathing treatment last night.   Not as tight this morning as she was yesterday.   Has some productive cough.   Has had flu contacts+.  Has had flu and stomach bug contacts at work.   LMP - 03/08/16.   Was suppose to go today for first prenatal, Dr. Senaida Ores, [facility].  No other aggravating or relieving factors. No other complaint.   Past Medical History:  Diagnosis Date  . Anxiety    history - no current problems  . Asthma    exercise induced rarely uses inhaler  . Elevated prolactin level (HCC) 10/2006   NEGATIVE MRI  . History of kidney stones    passes stones - no surgery required  . IBS (irritable bowel syndrome)   . PIH (pregnancy induced hypertension) 06/2011   PIH resolved after delivery   . Pneumonia 2010  . PONV (postoperative nausea and vomiting)   . SVD (spontaneous vaginal delivery)    x 1   Current Outpatient Prescriptions on File Prior to Visit  Medication Sig Dispense Refill  . albuterol (PROVENTIL) (2.5 MG/3ML) 0.083% nebulizer solution Take 3 mLs (2.5 mg total) by nebulization every 6 (six) hours as needed for wheezing or shortness of breath. 360 mL 1  . Albuterol Sulfate (PROAIR RESPICLICK) 108 (90 Base) MCG/ACT AEPB Inhale 2 puffs into the lungs every 4 (four) hours as needed. 1 each 2  . budesonide-formoterol (SYMBICORT) 160-4.5 MCG/ACT inhaler  Inhale 2 puffs into the lungs 2 (two) times daily. 1 Inhaler 11  . ALPRAZolam (XANAX) 0.25 MG tablet Take 1 tablet (0.25 mg total) by mouth 2 (two) times daily as needed for anxiety. (Patient not taking: Reported on 04/19/2016) 30 tablet 1   Current Facility-Administered Medications on File Prior to Visit  Medication Dose Route Frequency Provider Last Rate Last Dose  . ipratropium-albuterol (DUONEB) 0.5-2.5 (3) MG/3ML nebulizer solution 3 mL  3 mL Nebulization Q6H David S Tysinger, PA-C       ROS as in subjective   Objective: BP 116/70   Pulse 96   Wt 155 lb 12.8 oz (70.7 kg)   SpO2 99%   BMI 25.93 kg/m   Wt Readings from Last 3 Encounters:  04/19/16 155 lb 12.8 oz (70.7 kg)  03/16/16 156 lb 3.2 oz (70.9 kg)  10/05/15 150 lb (68 kg)   General: Ill-appearing, well-developed, well-nourished, lying on exam table Skin: warm, dry, no tenting HEENT: Nose inflamed and congested, clear conjunctiva, TMs pearly, no sinus tenderness, pharynx with erythema and post nasal drainage, no exudates Neck: Supple, non tender, no cervical adenopathy Heart: Regular rate and rhythm, normal S1, S2, no murmurs Lungs: decreased breath sounds in general, no wheezes, rales, rhonchi, no obvious dullness Abdomen: Non tender non distended Extremities: Mild generalized tenderness    Assessment: Encounter Diagnoses  Name Primary?  . Flu-like symptoms Yes  . Sore throat   . Body aches   . Nausea   . Moderate persistent asthma without complication   . Less than [redacted] weeks gestation of pregnancy      Plan: Clinically she appears to have viral flu like illness despite negative flu and strep swabs.   She has had +exposure to flu and stomach virus contacts at work.    Discussed diagnosis of influenza. Discussed supportive care including rest, hydration, OTC Tylenol or NSAID for fever, aches, and malaise.  Discussed period of contagion, self quarantine at home away from others to avoid spread of disease,  discussed means of transmission, and possible complications including pneumonia.  If worse or not improving within the next 4-5 days, then call or return.  Stressed hydration, rest, Tylenol for fever/aches, emetrol OTC for nausea.  If much worse in next few days get rechecked immediately.      Patient voiced understanding of diagnosis, recommendations, and treatment plan.  After visit summary given.  Gave note for work.

## 2016-04-22 ENCOUNTER — Telehealth: Payer: Self-pay | Admitting: Medical

## 2016-04-22 NOTE — Telephone Encounter (Signed)
Please call and see how she is doing.

## 2016-04-22 NOTE — Telephone Encounter (Signed)
Spoke with pt she said that she is feeling a little better, and that she is trying to drink a lot of water . She is eating jello also , she said that she is a little weight loss but not much.

## 2016-05-10 ENCOUNTER — Ambulatory Visit (INDEPENDENT_AMBULATORY_CARE_PROVIDER_SITE_OTHER): Payer: BLUE CROSS/BLUE SHIELD | Admitting: Medical

## 2016-05-10 VITALS — BP 128/80 | HR 111 | Temp 98.7°F | Wt 157.2 lb

## 2016-05-10 DIAGNOSIS — Z3A09 9 weeks gestation of pregnancy: Secondary | ICD-10-CM

## 2016-05-10 DIAGNOSIS — J029 Acute pharyngitis, unspecified: Secondary | ICD-10-CM | POA: Diagnosis not present

## 2016-05-10 DIAGNOSIS — H9201 Otalgia, right ear: Secondary | ICD-10-CM

## 2016-05-10 DIAGNOSIS — J4542 Moderate persistent asthma with status asthmaticus: Secondary | ICD-10-CM | POA: Diagnosis not present

## 2016-05-10 DIAGNOSIS — J309 Allergic rhinitis, unspecified: Secondary | ICD-10-CM | POA: Diagnosis not present

## 2016-05-10 NOTE — Progress Notes (Signed)
  Subjective: Andrea Boyer is a 36 y.o. female who presents for evaluation of sore throat.  Of note, she is currently [redacted] weeks pregnant.  Since last visit she has had some lymph nodes tender and a little enlarged behind both ears, having some worsening ear pain. Started right, but now right and left.  Has some neck and sore throat pain.     Breathing is ok today and recently, is having to use home nebs some with albuterol occasionally of late.  Still using Symbicort 2 puffs BID.   Patient is not a smoker.  No other aggravating or relieving factors.  No other c/o.  The following portions of the patient's history were reviewed and updated as appropriate: allergies, current medications, past medical history, past social history, past surgical history and problem list.  Past Medical History:  Diagnosis Date  . Anxiety    history - no current problems  . Asthma    exercise induced rarely uses inhaler  . Elevated prolactin level (HCC) 10/2006   NEGATIVE MRI  . History of kidney stones    passes stones - no surgery required  . IBS (irritable bowel syndrome)   . PIH (pregnancy induced hypertension) 06/2011   PIH resolved after delivery   . Pneumonia 2010  . PONV (postoperative nausea and vomiting)   . SVD (spontaneous vaginal delivery)    x 1    ROS as in subjective    Objective: BP 128/80   Pulse (!) 111   Temp 98.7 F (37.1 C)   Wt 157 lb 3.2 oz (71.3 kg)   SpO2 98%   BMI 26.16 kg/m   General appearance: no distress, WD/WN HEENT: normocephalic, conjunctiva/corneas normal, sclerae anicteric, scar tissue bilat TMs, nares patent, no discharge or erythema, pharynx without erythema, no exudate.  Oral cavity: MMM, no lesions  Neck: supple, shoddy mildly tender post auricular nodes, bilat, no lymphadenopathy, no thyromegaly Heart: RRR, normal S1, S2, no murmurs Lungs: CTA bilaterally, no wheezes, rhonchi, or rales Ext: no edema   Assessment: Encounter Diagnoses  Name Primary?    . Moderate persistent asthma with status asthmaticus Yes  . Allergic rhinitis, unspecified chronicity, unspecified seasonality, unspecified trigger   . Otalgia of right ear   . Sore throat   . [redacted] weeks gestation of pregnancy      Plan: discussed her concerns, symptoms. No obvious infection.   Use zyrtec daily, can use nasal saline, salt water gargles, c/t Symbicort 2 puffs BID, rinse mouth out with water after use, c/t albuterol nebs.  Discussed use of peak flow meter, monitoring home symptoms.   PFT done today.  Will likely need to bump up albuterol use as spring approaches.    Andrea Boyer was seen today for sore thorat  and ear pain.  Diagnoses and all orders for this visit:  Moderate persistent asthma with status asthmaticus -     Spirometry with Graph  Allergic rhinitis, unspecified chronicity, unspecified seasonality, unspecified trigger  Otalgia of right ear  Sore throat  [redacted] weeks gestation of pregnancy  f/u prn, 27mo othewrise

## 2016-05-11 DIAGNOSIS — O358XX0 Maternal care for other (suspected) fetal abnormality and damage, not applicable or unspecified: Secondary | ICD-10-CM | POA: Diagnosis not present

## 2016-05-11 DIAGNOSIS — Z3481 Encounter for supervision of other normal pregnancy, first trimester: Secondary | ICD-10-CM | POA: Diagnosis not present

## 2016-05-11 DIAGNOSIS — Z124 Encounter for screening for malignant neoplasm of cervix: Secondary | ICD-10-CM | POA: Diagnosis not present

## 2016-05-11 DIAGNOSIS — Z3A09 9 weeks gestation of pregnancy: Secondary | ICD-10-CM | POA: Diagnosis not present

## 2016-05-11 DIAGNOSIS — O26891 Other specified pregnancy related conditions, first trimester: Secondary | ICD-10-CM | POA: Diagnosis not present

## 2016-05-11 DIAGNOSIS — Z113 Encounter for screening for infections with a predominantly sexual mode of transmission: Secondary | ICD-10-CM | POA: Diagnosis not present

## 2016-05-11 DIAGNOSIS — Z3689 Encounter for other specified antenatal screening: Secondary | ICD-10-CM | POA: Diagnosis not present

## 2016-05-11 DIAGNOSIS — Z1151 Encounter for screening for human papillomavirus (HPV): Secondary | ICD-10-CM | POA: Diagnosis not present

## 2016-05-11 LAB — RESULTS CONSOLE HPV: CHL HPV: NEGATIVE

## 2016-05-11 LAB — HM PAP SMEAR: HM Pap smear: NEGATIVE

## 2016-05-13 LAB — OB RESULTS CONSOLE HEPATITIS B SURFACE ANTIGEN: HEP B S AG: NEGATIVE

## 2016-05-13 LAB — OB RESULTS CONSOLE GC/CHLAMYDIA
CHLAMYDIA, DNA PROBE: NEGATIVE
Gonorrhea: NEGATIVE

## 2016-05-13 LAB — OB RESULTS CONSOLE ABO/RH: RH TYPE: NEGATIVE

## 2016-05-13 LAB — OB RESULTS CONSOLE HIV ANTIBODY (ROUTINE TESTING): HIV: NONREACTIVE

## 2016-05-13 LAB — OB RESULTS CONSOLE RPR: RPR: NONREACTIVE

## 2016-05-13 LAB — OB RESULTS CONSOLE RUBELLA ANTIBODY, IGM: Rubella: IMMUNE

## 2016-05-13 LAB — OB RESULTS CONSOLE ANTIBODY SCREEN: ANTIBODY SCREEN: NEGATIVE

## 2016-06-08 DIAGNOSIS — O358XX1 Maternal care for other (suspected) fetal abnormality and damage, fetus 1: Secondary | ICD-10-CM | POA: Diagnosis not present

## 2016-06-08 DIAGNOSIS — Z3A13 13 weeks gestation of pregnancy: Secondary | ICD-10-CM | POA: Diagnosis not present

## 2016-07-18 DIAGNOSIS — Z3A18 18 weeks gestation of pregnancy: Secondary | ICD-10-CM | POA: Diagnosis not present

## 2016-07-18 DIAGNOSIS — Z363 Encounter for antenatal screening for malformations: Secondary | ICD-10-CM | POA: Diagnosis not present

## 2016-07-28 DIAGNOSIS — Z3A20 20 weeks gestation of pregnancy: Secondary | ICD-10-CM | POA: Diagnosis not present

## 2016-07-28 DIAGNOSIS — O99512 Diseases of the respiratory system complicating pregnancy, second trimester: Secondary | ICD-10-CM | POA: Diagnosis not present

## 2016-09-01 DIAGNOSIS — Z369 Encounter for antenatal screening, unspecified: Secondary | ICD-10-CM | POA: Diagnosis not present

## 2016-09-01 DIAGNOSIS — Z3492 Encounter for supervision of normal pregnancy, unspecified, second trimester: Secondary | ICD-10-CM | POA: Diagnosis not present

## 2016-09-02 ENCOUNTER — Encounter (HOSPITAL_COMMUNITY): Payer: Self-pay | Admitting: *Deleted

## 2016-09-02 ENCOUNTER — Inpatient Hospital Stay (HOSPITAL_COMMUNITY): Payer: BLUE CROSS/BLUE SHIELD

## 2016-09-02 ENCOUNTER — Inpatient Hospital Stay (HOSPITAL_COMMUNITY)
Admission: AD | Admit: 2016-09-02 | Discharge: 2016-09-02 | Disposition: A | Discharge status: Home / Self Care | Payer: BLUE CROSS/BLUE SHIELD | Source: Ambulatory Visit | Attending: Obstetrics and Gynecology | Admitting: Obstetrics and Gynecology

## 2016-09-02 DIAGNOSIS — F419 Anxiety disorder, unspecified: Secondary | ICD-10-CM | POA: Insufficient documentation

## 2016-09-02 DIAGNOSIS — O162 Unspecified maternal hypertension, second trimester: Secondary | ICD-10-CM | POA: Diagnosis not present

## 2016-09-02 DIAGNOSIS — K589 Irritable bowel syndrome without diarrhea: Secondary | ICD-10-CM | POA: Insufficient documentation

## 2016-09-02 DIAGNOSIS — Z8371 Family history of colonic polyps: Secondary | ICD-10-CM | POA: Insufficient documentation

## 2016-09-02 DIAGNOSIS — R1030 Lower abdominal pain, unspecified: Secondary | ICD-10-CM | POA: Insufficient documentation

## 2016-09-02 DIAGNOSIS — Z800 Family history of malignant neoplasm of digestive organs: Secondary | ICD-10-CM | POA: Diagnosis not present

## 2016-09-02 DIAGNOSIS — J45909 Unspecified asthma, uncomplicated: Secondary | ICD-10-CM | POA: Diagnosis not present

## 2016-09-02 DIAGNOSIS — Z801 Family history of malignant neoplasm of trachea, bronchus and lung: Secondary | ICD-10-CM | POA: Diagnosis not present

## 2016-09-02 DIAGNOSIS — R030 Elevated blood-pressure reading, without diagnosis of hypertension: Secondary | ICD-10-CM | POA: Insufficient documentation

## 2016-09-02 DIAGNOSIS — O99612 Diseases of the digestive system complicating pregnancy, second trimester: Secondary | ICD-10-CM | POA: Diagnosis not present

## 2016-09-02 DIAGNOSIS — Z825 Family history of asthma and other chronic lower respiratory diseases: Secondary | ICD-10-CM | POA: Diagnosis not present

## 2016-09-02 DIAGNOSIS — T1490XA Injury, unspecified, initial encounter: Secondary | ICD-10-CM | POA: Diagnosis not present

## 2016-09-02 DIAGNOSIS — Z79899 Other long term (current) drug therapy: Secondary | ICD-10-CM | POA: Insufficient documentation

## 2016-09-02 DIAGNOSIS — O99342 Other mental disorders complicating pregnancy, second trimester: Secondary | ICD-10-CM | POA: Diagnosis not present

## 2016-09-02 DIAGNOSIS — Z9889 Other specified postprocedural states: Secondary | ICD-10-CM | POA: Insufficient documentation

## 2016-09-02 DIAGNOSIS — Y92410 Unspecified street and highway as the place of occurrence of the external cause: Secondary | ICD-10-CM | POA: Diagnosis not present

## 2016-09-02 DIAGNOSIS — H538 Other visual disturbances: Secondary | ICD-10-CM | POA: Insufficient documentation

## 2016-09-02 DIAGNOSIS — O9A212 Injury, poisoning and certain other consequences of external causes complicating pregnancy, second trimester: Secondary | ICD-10-CM

## 2016-09-02 DIAGNOSIS — O26892 Other specified pregnancy related conditions, second trimester: Secondary | ICD-10-CM | POA: Insufficient documentation

## 2016-09-02 DIAGNOSIS — Z8261 Family history of arthritis: Secondary | ICD-10-CM | POA: Insufficient documentation

## 2016-09-02 DIAGNOSIS — R51 Headache: Secondary | ICD-10-CM | POA: Insufficient documentation

## 2016-09-02 DIAGNOSIS — O99512 Diseases of the respiratory system complicating pregnancy, second trimester: Secondary | ICD-10-CM | POA: Diagnosis not present

## 2016-09-02 DIAGNOSIS — Z833 Family history of diabetes mellitus: Secondary | ICD-10-CM | POA: Diagnosis not present

## 2016-09-02 DIAGNOSIS — Z3A25 25 weeks gestation of pregnancy: Secondary | ICD-10-CM | POA: Diagnosis not present

## 2016-09-02 DIAGNOSIS — Z87442 Personal history of urinary calculi: Secondary | ICD-10-CM | POA: Insufficient documentation

## 2016-09-02 DIAGNOSIS — Z3689 Encounter for other specified antenatal screening: Secondary | ICD-10-CM | POA: Diagnosis not present

## 2016-09-02 DIAGNOSIS — Z8249 Family history of ischemic heart disease and other diseases of the circulatory system: Secondary | ICD-10-CM | POA: Insufficient documentation

## 2016-09-02 LAB — URINALYSIS, ROUTINE W REFLEX MICROSCOPIC
BILIRUBIN URINE: NEGATIVE
GLUCOSE, UA: NEGATIVE mg/dL
Hgb urine dipstick: NEGATIVE
KETONES UR: 5 mg/dL — AB
NITRITE: NEGATIVE
PH: 7 (ref 5.0–8.0)
Protein, ur: NEGATIVE mg/dL
Specific Gravity, Urine: 1.01 (ref 1.005–1.030)

## 2016-09-02 LAB — CBC
HCT: 35.2 % — ABNORMAL LOW (ref 36.0–46.0)
Hemoglobin: 12 g/dL (ref 12.0–15.0)
MCH: 30.6 pg (ref 26.0–34.0)
MCHC: 34.1 g/dL (ref 30.0–36.0)
MCV: 89.8 fL (ref 78.0–100.0)
PLATELETS: 186 10*3/uL (ref 150–400)
RBC: 3.92 MIL/uL (ref 3.87–5.11)
RDW: 13.8 % (ref 11.5–15.5)
WBC: 9 10*3/uL (ref 4.0–10.5)

## 2016-09-02 LAB — COMPREHENSIVE METABOLIC PANEL
ALK PHOS: 45 U/L (ref 38–126)
ALT: 15 U/L (ref 14–54)
ANION GAP: 9 (ref 5–15)
AST: 15 U/L (ref 15–41)
Albumin: 3.7 g/dL (ref 3.5–5.0)
BUN: 7 mg/dL (ref 6–20)
CALCIUM: 9.7 mg/dL (ref 8.9–10.3)
CHLORIDE: 105 mmol/L (ref 101–111)
CO2: 23 mmol/L (ref 22–32)
Creatinine, Ser: 0.49 mg/dL (ref 0.44–1.00)
Glucose, Bld: 91 mg/dL (ref 65–99)
Potassium: 3.5 mmol/L (ref 3.5–5.1)
SODIUM: 137 mmol/L (ref 135–145)
Total Bilirubin: 0.5 mg/dL (ref 0.3–1.2)
Total Protein: 7 g/dL (ref 6.5–8.1)

## 2016-09-02 LAB — PROTEIN / CREATININE RATIO, URINE
CREATININE, URINE: 50 mg/dL
PROTEIN CREATININE RATIO: 0.14 mg/mg{creat} (ref 0.00–0.15)
TOTAL PROTEIN, URINE: 7 mg/dL

## 2016-09-02 LAB — KLEIHAUER-BETKE STAIN
# VIALS RHIG: 1
Fetal Cells %: 0 %
Quantitation Fetal Hemoglobin: 0 mL
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
## 2016-09-02 NOTE — Discharge Instructions (Signed)
Preventing Injuries During Pregnancy °Trauma is the most common cause of injury and death in pregnant women. This can also result in serious harm to the baby or even death. °How can injuries affect my pregnancy? °Your baby is protected in the womb (uterus) by a sac filled with fluid (amniotic sac). Your baby can be harmed if there is a direct blow to your abdomen and pelvis. Trauma may be caused by: °· Falls. These are more common in the second and third trimester of pregnancy. °· Automobile accidents. °· Domestic violence or assault. °· Severe burns, such as from fire or electricity. ° °These injuries can result in: °· Tearing of your uterus. °· The placenta pulling away from the wall of the uterus (placental abruption). °· The amniotic sac breaking open (rupture of membranes). °· Blockage or decrease in the blood supply to your baby. °· Going into labor earlier than expected. °· Severe injuries to other parts of your body, such as your brain, spine, heart, lungs, or other organs. ° °Minor falls and low-impact automobile accidents do not usually harm your baby, even if they cause a little harm to you. °What can I do to lower my risk? °Safety °· Remove slippery rugs and loose objects on the floor. They increase your risk of tripping or slipping. °· Wear comfortable shoes that have a good grip on the sole. Do not wear high-heeled shoes. °· Always wear your seat belt properly when riding in a car. Use both the lap and shoulder belt, with the lap belt below your abdomen. Always practice safe driving. Do not ride on a motorcycle while pregnant. °Activity °· Avoid walking on wet or slippery floors. °· Do not participate in rough and violent activities or sports. °· Avoid high-risk situations and activities such as: °? Lifting heavy pots of boiling or hot liquids. °? Fixing electrical problems. °? Being near fires or starting fires. °General instructions °· Take over-the-counter and prescription medicines only as told by  your health care provider. °· Know your blood type and the father's blood type in case you develop vaginal bleeding or experience an injury for which a blood transfusion is needed. °· Spousal abuse can be a serious cause of trauma during pregnancy. If you are a victim of domestic violence or assault: °? Call your local emergency services (911 in the [place] °? Contact the National Domestic Violence Hotline for help and support. °When should I seek immediate medical care? °Get help right away if: °· You fall on your abdomen or experience any serious blow to your abdomen. °· You develop stiffness in your neck or pain after a fall or from other trauma. °· You develop a headache or vision problems after a fall or from other trauma. °· You do not feel the baby moving after a fall or trauma, or you feel that the baby is not moving as much as before the fall or trauma. °· You have been the victim of domestic violence or any other kind of physical attack. °· You have been in a car accident. °· You develop vaginal bleeding. °· You have fluid leaking from the vagina. °· You develop uterine contractions. Symptoms include pelvic cramping, pain, or serious low back pain. °· You become weak, faint, or have uncontrolled vomiting after trauma. °· You have a serious burn. This includes burns to the face, neck, hands, or genitals, or burns greater than the size of your palm anywhere else. ° °Summary °· Trauma is the most common cause of   injury and death in pregnant women and can also lead to injury or death of the baby.  Falls, automobile accidents, domestic violence or assault, and severe burns can injure you or your baby. Make sure to get medical help right away if you experience any of these during your pregnancy.  Take steps to prevent slips or falls in your home, such as avoiding slippery floors and removing loose rugs.  Always wear your seat belt properly when riding in a car. Practice safe driving. This information is  not intended to replace advice given to you by your health care provider. Make sure you discuss any questions you have with your health care provider. Document Released: 04/14/2004 Document Revised: 03/16/2016 Document Reviewed: 03/16/2016 Elsevier Interactive Patient Education  2017 [employer] Hypertension During Pregnancy Hypertension, commonly called high blood pressure, is when the force of blood pumping through your arteries is too strong. Arteries are blood vessels that carry blood from the heart throughout the body. Hypertension during pregnancy can cause problems for you and your baby. Your baby may be born early (prematurely) or may not weigh as much as he or she should at birth. Very bad cases of hypertension during pregnancy can be life-threatening. Different types of hypertension can occur during pregnancy. These include:  Chronic hypertension. This happens when: ? You have hypertension before pregnancy and it continues during pregnancy. ? You develop hypertension before you are [redacted] weeks pregnant, and it continues during pregnancy.  Gestational hypertension. This is hypertension that develops after the 20th week of pregnancy.  Preeclampsia, also called toxemia of pregnancy. This is a very serious type of hypertension that develops only during pregnancy. It affects the whole body, and it can be very dangerous for you and your baby.  Gestational hypertension and preeclampsia usually go away within 6 weeks after your baby is born. Women who have hypertension during pregnancy have a greater chance of developing hypertension later in life or during future pregnancies. What are the causes? The exact cause of hypertension is not known. What increases the risk? There are certain factors that make it more likely for you to develop hypertension during pregnancy. These include:  Having hypertension during a previous pregnancy or prior to pregnancy.  Being overweight.  Being older than  age 50.  Being pregnant for the first time or being pregnant with more than one baby.  Becoming pregnant using fertilization methods such as IVF (in vitro fertilization).  Having diabetes, kidney problems, or systemic lupus erythematosus.  Having a family history of hypertension.  What are the signs or symptoms? Chronic hypertension and gestational hypertension rarely cause symptoms. Preeclampsia causes symptoms, which may include:  Increased protein in your urine. Your health care provider will check for this at every visit before you give birth (prenatal visit).  Severe headaches.  Sudden weight gain.  Swelling of the hands, face, legs, and feet.  Nausea and vomiting.  Vision problems, such as blurred or double vision.  Numbness in the face, arms, legs, and feet.  Dizziness.  Slurred speech.  Sensitivity to bright lights.  Abdominal pain.  Convulsions.  How is this diagnosed? You may be diagnosed with hypertension during a routine prenatal exam. At each prenatal visit, you may:  Have a urine test to check for high amounts of protein in your urine.  Have your blood pressure checked. A blood pressure reading is recorded as two numbers, such as "120 over 80" (or 120/80). The first ("top") number is called the systolic pressure.  It is a measure of the pressure in your arteries when your heart beats. The second ("bottom") number is called the diastolic pressure. It is a measure of the pressure in your arteries as your heart relaxes between beats. Blood pressure is measured in a unit called mm Hg. A normal blood pressure reading is: ? Systolic: below 120. ? Diastolic: below 80.  The type of hypertension that you are diagnosed with depends on your test results and when your symptoms developed.  Chronic hypertension is usually diagnosed before 20 weeks of pregnancy.  Gestational hypertension is usually diagnosed after 20 weeks of pregnancy.  Hypertension with high  amounts of protein in the urine is diagnosed as preeclampsia.  Blood pressure measurements that stay above 160 systolic, or above 110 diastolic, are signs of severe preeclampsia.  How is this treated? Treatment for hypertension during pregnancy varies depending on the type of hypertension you have and how serious it is.  If you take medicines called ACE inhibitors to treat chronic hypertension, you may need to switch medicines. ACE inhibitors should not be taken during pregnancy.  If you have gestational hypertension, you may need to take blood pressure medicine.  If you are at risk for preeclampsia, your health care provider may recommend that you take a low-dose aspirin every day to prevent high blood pressure during your pregnancy.  If you have severe preeclampsia, you may need to be hospitalized so you and your baby can be monitored closely. You may also need to take medicine (magnesium sulfate) to prevent seizures and to lower blood pressure. This medicine may be given as an injection or through an IV tube.  In some cases, if your condition gets worse, you may need to deliver your baby early.  Follow these instructions at home: Eating and drinking  Drink enough fluid to keep your urine clear or pale yellow.  Eat a healthy diet that is low in salt (sodium). Do not add salt to your food. Check food labels to see how much sodium a food or beverage contains. Lifestyle  Do not use any products that contain nicotine or tobacco, such as cigarettes and e-cigarettes. If you need help quitting, ask your health care provider.  Do not use alcohol.  Avoid caffeine.  Avoid stress as much as possible. Rest and get plenty of sleep. General instructions  Take over-the-counter and prescription medicines only as told by your health care provider.  While lying down, lie on your left side. This keeps pressure off your baby.  While sitting or lying down, raise (elevate) your feet. Try putting  some pillows under your lower legs.  Exercise regularly. Ask your health care provider what kinds of exercise are best for you.  Keep all prenatal and follow-up visits as told by your health care provider. This is important. Contact a health care provider if:  You have symptoms that your health care provider told you may require more treatment or monitoring, such as: ? Fever. ? Vomiting. ? Headache. Get help right away if:  You have severe abdominal pain or vomiting that does not get better with treatment.  You suddenly develop swelling in your hands, ankles, or face.  You gain 4 lbs (1.8 kg) or more in 1 week.  You develop vaginal bleeding, or you have blood in your urine.  You do not feel your baby moving as much as usual.  You have blurred or double vision.  You have muscle twitching or sudden tightening (spasms).  You have  shortness of breath.  Your lips or fingernails turn blue. This information is not intended to replace advice given to you by your health care provider. Make sure you discuss any questions you have with your health care provider. Document Released: 11/23/2010 Document Revised: 09/25/2015 Document Reviewed: 08/21/2015 Elsevier Interactive Patient Education  [employer]2018 [employer] Inc[employer]

## 2016-09-19 DIAGNOSIS — O139 Gestational [pregnancy-induced] hypertension without significant proteinuria, unspecified trimester: Secondary | ICD-10-CM | POA: Diagnosis not present

## 2016-09-20 ENCOUNTER — Other Ambulatory Visit: Payer: Self-pay | Admitting: Obstetrics and Gynecology

## 2016-09-20 ENCOUNTER — Ambulatory Visit (INDEPENDENT_AMBULATORY_CARE_PROVIDER_SITE_OTHER): Payer: BLUE CROSS/BLUE SHIELD

## 2016-09-20 VITALS — BP 157/79 | HR 100

## 2016-09-20 DIAGNOSIS — O152 Eclampsia in the puerperium: Secondary | ICD-10-CM

## 2016-09-20 DIAGNOSIS — Z3A28 28 weeks gestation of pregnancy: Secondary | ICD-10-CM | POA: Diagnosis not present

## 2016-09-20 DIAGNOSIS — O1403 Mild to moderate pre-eclampsia, third trimester: Secondary | ICD-10-CM | POA: Diagnosis not present

## 2016-09-20 DIAGNOSIS — O139 Gestational [pregnancy-induced] hypertension without significant proteinuria, unspecified trimester: Secondary | ICD-10-CM | POA: Diagnosis not present

## 2016-09-20 MED ORDER — BETAMETHASONE SOD PHOS & ACET 6 (3-3) MG/ML IJ SUSP
12.0000 mg | Freq: Once | INTRAMUSCULAR | Status: AC
  Administered 2016-09-20: 12 mg via INTRAMUSCULAR

## 2016-09-20 NOTE — Progress Notes (Signed)
Patient presented to the office today for betamethasone injection per her provider request. Patient tolerated well and will follow up tomorrow with second dose. Patient blood pressure was elevated. I have advised her to report this pressure back to her ob provider today. Patient voice understanding at this time.

## 2016-09-21 ENCOUNTER — Inpatient Hospital Stay (HOSPITAL_COMMUNITY)
Admission: AD | Admit: 2016-09-21 | Discharge: 2016-09-21 | Disposition: A | Discharge status: Home / Self Care | Payer: BLUE CROSS/BLUE SHIELD | Source: Ambulatory Visit | Attending: Obstetrics and Gynecology | Admitting: Obstetrics and Gynecology

## 2016-09-21 DIAGNOSIS — O152 Eclampsia in the puerperium: Secondary | ICD-10-CM | POA: Diagnosis not present

## 2016-09-21 MED ORDER — BETAMETHASONE SOD PHOS & ACET 6 (3-3) MG/ML IJ SUSP
12.0000 mg | Freq: Once | INTRAMUSCULAR | Status: AC
  Administered 2016-09-21: 12 mg via INTRAMUSCULAR
  Filled 2016-09-21: qty 2

## 2016-09-21 NOTE — MAU Note (Signed)
Pt here for 2nd methotrexate, received first at clinic yesterday, closed today due to holiday.  Is on bedrest, monitoring BP at home, parameters to call MD. Reviewed S&S of pre-eclampsia. No problem with first injection.  Has appt for f/u  At office on Friday.

## 2016-09-23 DIAGNOSIS — O1403 Mild to moderate pre-eclampsia, third trimester: Secondary | ICD-10-CM | POA: Diagnosis not present

## 2016-09-23 DIAGNOSIS — Z365 Encounter for antenatal screening for isoimmunization: Secondary | ICD-10-CM | POA: Diagnosis not present

## 2016-09-23 DIAGNOSIS — Z3689 Encounter for other specified antenatal screening: Secondary | ICD-10-CM | POA: Diagnosis not present

## 2016-09-23 DIAGNOSIS — Z3A28 28 weeks gestation of pregnancy: Secondary | ICD-10-CM | POA: Diagnosis not present

## 2016-09-27 DIAGNOSIS — Z3A29 29 weeks gestation of pregnancy: Secondary | ICD-10-CM | POA: Diagnosis not present

## 2016-09-27 DIAGNOSIS — O1403 Mild to moderate pre-eclampsia, third trimester: Secondary | ICD-10-CM | POA: Diagnosis not present

## 2016-09-29 DIAGNOSIS — O1403 Mild to moderate pre-eclampsia, third trimester: Secondary | ICD-10-CM | POA: Diagnosis not present

## 2016-09-29 DIAGNOSIS — Z3A29 29 weeks gestation of pregnancy: Secondary | ICD-10-CM | POA: Diagnosis not present

## 2016-09-29 DIAGNOSIS — O139 Gestational [pregnancy-induced] hypertension without significant proteinuria, unspecified trimester: Secondary | ICD-10-CM | POA: Diagnosis not present

## 2016-10-03 DIAGNOSIS — O1403 Mild to moderate pre-eclampsia, third trimester: Secondary | ICD-10-CM | POA: Diagnosis not present

## 2016-10-03 DIAGNOSIS — Z3A29 29 weeks gestation of pregnancy: Secondary | ICD-10-CM | POA: Diagnosis not present

## 2016-10-06 DIAGNOSIS — O1403 Mild to moderate pre-eclampsia, third trimester: Secondary | ICD-10-CM | POA: Diagnosis not present

## 2016-10-06 DIAGNOSIS — Z3A30 30 weeks gestation of pregnancy: Secondary | ICD-10-CM | POA: Diagnosis not present

## 2016-10-10 DIAGNOSIS — Z3A30 30 weeks gestation of pregnancy: Secondary | ICD-10-CM | POA: Diagnosis not present

## 2016-10-10 DIAGNOSIS — O1403 Mild to moderate pre-eclampsia, third trimester: Secondary | ICD-10-CM | POA: Diagnosis not present

## 2016-10-13 DIAGNOSIS — O1403 Mild to moderate pre-eclampsia, third trimester: Secondary | ICD-10-CM | POA: Diagnosis not present

## 2016-10-13 DIAGNOSIS — Z3A31 31 weeks gestation of pregnancy: Secondary | ICD-10-CM | POA: Diagnosis not present

## 2016-10-17 DIAGNOSIS — Z3A31 31 weeks gestation of pregnancy: Secondary | ICD-10-CM | POA: Diagnosis not present

## 2016-10-17 DIAGNOSIS — O1403 Mild to moderate pre-eclampsia, third trimester: Secondary | ICD-10-CM | POA: Diagnosis not present

## 2016-10-20 ENCOUNTER — Inpatient Hospital Stay (HOSPITAL_COMMUNITY): Payer: BLUE CROSS/BLUE SHIELD

## 2016-10-20 ENCOUNTER — Encounter (HOSPITAL_COMMUNITY): Payer: Self-pay | Admitting: *Deleted

## 2016-10-20 ENCOUNTER — Inpatient Hospital Stay (HOSPITAL_COMMUNITY)
Admission: AD | Admit: 2016-10-20 | Discharge: 2016-10-20 | Disposition: A | Discharge status: Home / Self Care | Payer: BLUE CROSS/BLUE SHIELD | Source: Ambulatory Visit | Attending: Obstetrics and Gynecology | Admitting: Obstetrics and Gynecology

## 2016-10-20 DIAGNOSIS — R0602 Shortness of breath: Secondary | ICD-10-CM | POA: Diagnosis not present

## 2016-10-20 DIAGNOSIS — Z3A32 32 weeks gestation of pregnancy: Secondary | ICD-10-CM

## 2016-10-20 DIAGNOSIS — O99513 Diseases of the respiratory system complicating pregnancy, third trimester: Secondary | ICD-10-CM | POA: Insufficient documentation

## 2016-10-20 DIAGNOSIS — O1493 Unspecified pre-eclampsia, third trimester: Secondary | ICD-10-CM | POA: Diagnosis not present

## 2016-10-20 DIAGNOSIS — J45909 Unspecified asthma, uncomplicated: Secondary | ICD-10-CM | POA: Diagnosis not present

## 2016-10-20 DIAGNOSIS — O1403 Mild to moderate pre-eclampsia, third trimester: Secondary | ICD-10-CM | POA: Diagnosis not present

## 2016-10-20 DIAGNOSIS — Z8709 Personal history of other diseases of the respiratory system: Secondary | ICD-10-CM

## 2016-10-20 LAB — COMPREHENSIVE METABOLIC PANEL
ALT: 14 U/L (ref 14–54)
AST: 16 U/L (ref 15–41)
Albumin: 3.4 g/dL — ABNORMAL LOW (ref 3.5–5.0)
Alkaline Phosphatase: 54 U/L (ref 38–126)
Anion gap: 9 (ref 5–15)
BILIRUBIN TOTAL: 0.5 mg/dL (ref 0.3–1.2)
BUN: 8 mg/dL (ref 6–20)
CO2: 26 mmol/L (ref 22–32)
CREATININE: 0.46 mg/dL (ref 0.44–1.00)
Calcium: 10.2 mg/dL (ref 8.9–10.3)
Chloride: 101 mmol/L (ref 101–111)
Glucose, Bld: 80 mg/dL (ref 65–99)
POTASSIUM: 3.9 mmol/L (ref 3.5–5.1)
Sodium: 136 mmol/L (ref 135–145)
TOTAL PROTEIN: 7.2 g/dL (ref 6.5–8.1)

## 2016-10-20 LAB — CBC WITH DIFFERENTIAL/PLATELET
BASOS ABS: 0 10*3/uL (ref 0.0–0.1)
Basophils Relative: 0 %
EOS PCT: 1 %
Eosinophils Absolute: 0.1 10*3/uL (ref 0.0–0.7)
HEMATOCRIT: 36.9 % (ref 36.0–46.0)
Hemoglobin: 12.4 g/dL (ref 12.0–15.0)
LYMPHS ABS: 2.1 10*3/uL (ref 0.7–4.0)
Lymphocytes Relative: 21 %
MCH: 30.1 pg (ref 26.0–34.0)
MCHC: 33.6 g/dL (ref 30.0–36.0)
MCV: 89.6 fL (ref 78.0–100.0)
MONO ABS: 0.4 10*3/uL (ref 0.1–1.0)
Monocytes Relative: 4 %
NEUTROS ABS: 7.4 10*3/uL (ref 1.7–7.7)
Neutrophils Relative %: 74 %
Platelets: 185 10*3/uL (ref 150–400)
RBC: 4.12 MIL/uL (ref 3.87–5.11)
RDW: 14.3 % (ref 11.5–15.5)
WBC: 10 10*3/uL (ref 4.0–10.5)

## 2016-10-20 LAB — POCT PREGNANCY, URINE: PREG TEST UR: POSITIVE — AB

## 2016-10-20 MED ORDER — IPRATROPIUM-ALBUTEROL 0.5-2.5 (3) MG/3ML IN SOLN: 3.0000 mL | RESPIRATORY_TRACT | 0 refills | Status: AC | PRN

## 2016-10-20 MED ORDER — IPRATROPIUM-ALBUTEROL 0.5-2.5 (3) MG/3ML IN SOLN
3.0000 mL | Freq: Once | RESPIRATORY_TRACT | Status: AC
  Administered 2016-10-20: 3 mL via RESPIRATORY_TRACT
  Filled 2016-10-20: qty 3

## 2016-10-20 NOTE — Progress Notes (Addendum)
G2P1 @ 32.[redacted] wksga. Presents to triage for SOB. Hx of asthma and had done home neb treatment but pt still feeling SOB despite tx. Denies LOF or bleeding +FM. EFM applied by charge nurse.   1120: respiratory at bs treating pt with neb treatments. Pt stating feeling better.   1140: respiratory notified and in unit for 2nd dose of neb treatment.   1236: discharge instructions given with pt understanding 1250: pt left unit ambulatory with family

## 2016-10-20 NOTE — Discharge Instructions (Signed)
Asthma Attack Prevention, Adult Although you may not be able to control the fact that you have asthma, you can take actions to prevent episodes of asthma (asthma attacks). These actions include:  Creating a written plan for managing and treating your asthma attacks (asthma action plan).  Monitoring your asthma.  Avoiding things that can irritate your airways or make your asthma symptoms worse (asthma triggers).  Taking your medicines as directed.  Acting quickly if you have signs or symptoms of an asthma attack. What are some ways to prevent an asthma attack? Create a plan Work with your health care provider to create an asthma action plan. This plan should include:  A list of your asthma triggers and how to avoid them.  A list of symptoms that you experience during an asthma attack.  Information about when to take medicine and how much medicine to take.  Information to help you understand your peak flow measurements.  Contact information for your health care providers.  Daily actions that you can take to control asthma. Monitor your asthma   To monitor your asthma:  Use your peak flow meter every morning and every evening for 2-3 weeks. Record the results in a journal. A drop in your peak flow numbers on one or more days may mean that you are starting to have an asthma attack, even if you are not having symptoms.  When you have asthma symptoms, write them down in a journal. Avoid asthma triggers   Work with your health care provider to find out what your asthma triggers are. This can be done by:  Being tested for allergies.  Keeping a journal that notes when asthma attacks occur and what may have contributed to them.  Asking your health care provider whether other medical conditions make your asthma worse. Common asthma triggers include:  Dust.  Smoke. This includes campfire smoke and secondhand smoke from tobacco products.  Pet dander.  Trees, grasses or  pollens.  Very cold, dry, or humid air.  Mold.  Foods that contain high amounts of sulfites.  Strong smells.  Engine exhaust and air pollution.  Aerosol sprays and fumes from household cleaners.  Household pests and their droppings, including dust mites and cockroaches.  Certain medicines, including NSAIDs. Once you have determined your asthma triggers, take steps to avoid them. Depending on your triggers, you may be able to reduce the chance of an asthma attack by:  Keeping your home clean. Have someone dust and vacuum your home for you 1 or 2 times a week. If possible, have them use a high-efficiency particulate arrestance (HEPA) vacuum.  Washing your sheets weekly in hot water.  Using allergy-proof mattress covers and casings on your bed.  Keeping pets out of your home.  Taking care of mold and water problems in your home.  Avoiding areas where people smoke.  Avoiding using strong perfumes or odor sprays.  Avoid spending a lot of time outdoors when pollen counts are high and on very windy days.  Talking with your health care provider before stopping or starting any new medicines. Medicines Take over-the-counter and prescription medicines only as told by your health care provider. Many asthma attacks can be prevented by carefully following your medicine schedule. Taking your medicines correctly is especially important when you cannot avoid certain asthma triggers. Even if you are doing well, do not stop taking your medicine and do not take less medicine. Act quickly If an asthma attack happens, acting quickly can decrease how severe   it is and how long it lasts. Take these actions:  Pay attention to your symptoms. If you are coughing, wheezing, or having difficulty breathing, do not wait to see if your symptoms go away on their own. Follow your asthma action plan.  If you have followed your asthma action plan and your symptoms are not improving, call your health care  provider or seek immediate medical care at the nearest hospital. It is important to write down how often you need to use your fast-acting rescue inhaler. You can track how often you use an inhaler in your journal. If you are using your rescue inhaler more often, it may mean that your asthma is not under control. Adjusting your asthma treatment plan may help you to prevent future asthma attacks and help you to gain better control of your condition. How can I prevent an asthma attack when I exercise?   Exercise is a common asthma trigger. To prevent asthma attacks during exercise:  Follow advice from your health care provider about whether you should use your fast-acting inhaler before exercising. Many people with asthma experience exercise-induced bronchoconstriction (EIB). This condition often worsens during vigorous exercise in cold, humid, or dry environments. Usually, people with EIB can stay very active by using a fast-acting inhaler before exercising.  Avoid exercising outdoors in very cold or humid weather.  Avoid exercising outdoors when pollen counts are high.  Warm up and cool down when exercising.  Stop exercising right away if asthma symptoms start. Consider taking part in exercises that are less likely to cause asthma symptoms such as:  Indoor swimming.  Biking.  Walking.  Hiking.  Playing football. This information is not intended to replace advice given to you by your health care provider. Make sure you discuss any questions you have with your health care provider. Document Released: 02/23/2009 Document Revised: 11/06/2015 Document Reviewed: 08/22/2015 Elsevier Interactive Patient Education  2017 [employer]  

## 2016-10-20 NOTE — MAU Provider Note (Signed)
History     CSN: [id number]  Arrival date and time: 10/20/16 [id number]  First Provider Initiated Contact with Patient 10/20/16 1043      Chief Complaint  Patient presents with  . Shortness of Breath   HPI Andrea Boyer is a 36 y.o. G2P1001 at 5167w2d who presents with shortness of breath. Was seen by Dr. Senaida Oresichardson in office today and was sent over for further evaluation & chest xray. Reports symptoms began a week ago & have worsened. Describes as SOB & chest tightness. Patient is asthmatic & takes Singulair, pro air, & Symbicort at home. Last saw her PCP at the beginning of pregnancy for these symptoms & was told that she would need to start seeing a pulmonologist; hasn't seen one yet. Denies headache, visual disturbance, chest pain, cough, sore throat, abdominal pain. Positive fetal movement.  Diagnosed with preeclampsia earlier this month.   OB History    Gravida Para Term Preterm AB Living   2 1 1     1    SAB TAB Ectopic Multiple Live Births           1      Past Medical History:  Diagnosis Date  . Anxiety    history - no current problems  . Asthma    exercise induced rarely uses inhaler  . Elevated prolactin level (HCC) 10/2006   NEGATIVE MRI  . History of kidney stones    passes stones - no surgery required  . IBS (irritable bowel syndrome)   . PIH (pregnancy induced hypertension) 06/2011   PIH resolved after delivery   . Pneumonia 2010  . PONV (postoperative nausea and vomiting)   . SVD (spontaneous vaginal delivery)    x 1    Past Surgical History:  Procedure Laterality Date  . FOOT FRACTURE SURGERY Right 2004   removed a bone from a fx foot  . LAPAROSCOPIC LYSIS OF ADHESIONS N/A 11/29/2012   Procedure: LAPAROSCOPIC LYSIS OF ADHESIONS;  Surgeon: Oliver PilaKathy W Richardson, MD;  Location: [facility];  Service: Gynecology;  Laterality: N/A;  . LAPAROSCOPY N/A 11/29/2012   Procedure: LAPAROSCOPY OPERATIVE;  Surgeon: Oliver PilaKathy W Richardson, MD;  Location: [facility];  Service: Gynecology;   Laterality: N/A;  . TONSILLECTOMY  1991  . WISDOM TOOTH EXTRACTION      Family History  Problem Relation Age of Onset  . Hypertension Mother   . Arthritis Mother   . Colon polyps Mother   . Diabetes Father   . Hypertension Father   . Colon polyps Father   . Diabetes Maternal Grandmother   . COPD Maternal Grandmother   . Hypertension Maternal Grandmother   . Ovarian cysts Maternal Grandmother   . Arthritis Maternal Grandmother   . Heart disease Maternal Grandmother   . Hypertension Maternal Grandfather   . Hypertension Paternal Grandmother   . Lung cancer Paternal Grandmother   . Hypertension Paternal Grandfather   . Stomach cancer Paternal Grandfather   . Muscular dystrophy Cousin   . Diabetes Paternal Aunt   . Anesthesia problems Neg Hx   . Depression Neg Hx   . Asthma Neg Hx     Social History  Substance Use Topics  . Smoking status: Never Smoker  . Smokeless tobacco: Never Used  . Alcohol use No    Allergies: No Known Allergies  Facility-Administered Medications Prior to Admission  Medication Dose Route Frequency Provider Last Rate Last Dose  . ipratropium-albuterol (DUONEB) 0.5-2.5 (3) MG/3ML nebulizer solution 3 mL  3 mL Nebulization  Q6H Tysinger, Kermit Baloavid S, PA-C       Prescriptions Prior to Admission  Medication Sig Dispense Refill Last Dose  . acetaminophen (TYLENOL) 500 MG tablet Take 500 mg by mouth every 6 (six) hours as needed for mild pain or headache.   09/02/2016 at Unknown time  . albuterol (PROVENTIL) (2.5 MG/3ML) 0.083% nebulizer solution Take 3 mLs (2.5 mg total) by nebulization every 6 (six) hours as needed for wheezing or shortness of breath. 360 mL 1 Past Month at Unknown time  . Albuterol Sulfate (PROAIR RESPICLICK) 108 (90 Base) MCG/ACT AEPB Inhale 2 puffs into the lungs every 4 (four) hours as needed. (Patient taking differently: Inhale 2 puffs into the lungs every 4 (four) hours as needed (for shortness of breath/asthma). ) 1 each 2 09/02/2016 at  Unknown time  . budesonide-formoterol (SYMBICORT) 160-4.5 MCG/ACT inhaler Inhale 2 puffs into the lungs 2 (two) times daily. (Patient taking differently: Inhale 2 puffs into the lungs 2 (two) times daily as needed (for shortness of breath/asthma). ) 1 Inhaler 11 Past Month at Unknown time  . Prenatal Vit-Fe Fumarate-FA (PRENATAL MULTIVITAMIN) TABS tablet Take 1 tablet by mouth at bedtime.   09/01/2016 at Unknown time    Review of Systems  Constitutional: Negative.   Eyes: Negative for visual disturbance.  Respiratory: Positive for chest tightness, shortness of breath and wheezing. Negative for cough.   Cardiovascular: Negative.   Gastrointestinal: Negative.   Genitourinary: Negative.   Neurological: Negative for headaches.   Physical Exam   Blood pressure (!) 136/95, pulse (!) 107, temperature 97.8 F (36.6 C), temperature source Oral, resp. rate (!) 22, weight 177 lb 1.9 oz (80.3 kg), SpO2 97 %.  Patient Vitals for the past 24 hrs:  BP Temp Temp src Pulse Resp SpO2 Weight  10/20/16 1248 (!) 127/97 98.2 F (36.8 C) Oral (!) 121 20 - -  10/20/16 1126 - 97.8 F (36.6 C) Oral (!) 107 (!) 22 97 % -  10/20/16 1120 - - - - - 97 % -  10/20/16 1115 (!) 136/95 - - (!) 104 - (!) 84 % -  10/20/16 1110 - - - - - 100 % -  10/20/16 1105 - - - - - 97 % -  10/20/16 1100 (!) 154/99 - - (!) 113 - - -  10/20/16 1005 (!) 155/99 - - (!) 107 - - -  10/20/16 0954 (!) 147/105 97.7 F (36.5 C) Oral (!) 103 20 100 % 177 lb 1.9 oz (80.3 kg)     Physical Exam  Nursing note and vitals reviewed. Constitutional: She is oriented to person, place, and time. She appears well-developed and well-nourished. No distress.  HENT:  Head: Normocephalic and atraumatic.  Eyes: Conjunctivae are normal. Right eye exhibits no discharge. Left eye exhibits no discharge. No scleral icterus.  Neck: Normal range of motion.  Cardiovascular: Regular rhythm and normal heart sounds.  Tachycardia present.   No murmur  heard. Respiratory: Effort normal. No respiratory distress. She has decreased breath sounds in the right middle field, the right lower field and the left lower field. She has no wheezes.  GI: Soft. There is no tenderness.  Neurological: She is alert and oriented to person, place, and time. She has normal reflexes.  Skin: Skin is warm and dry. She is not diaphoretic.  Psychiatric: She has a normal mood and affect. Her behavior is normal. Judgment and thought content normal.   Fetal Tracing:  Baseline: 140 Variability: moderate Accelerations: 15x15 Decelerations: none  Toco: none MAU Course  Procedures Results for orders placed or performed during the hospital encounter of 10/20/16 (from the past 24 hour(s))  CBC with Differential/Platelet     Status: None   Collection Time: 10/20/16 10:13 AM  Result Value Ref Range   WBC 10.0 4.0 - 10.5 K/uL   RBC 4.12 3.87 - 5.11 MIL/uL   Hemoglobin 12.4 12.0 - 15.0 g/dL   HCT 11.9 14.7 - 82.9 %   MCV 89.6 78.0 - 100.0 fL   MCH 30.1 26.0 - 34.0 pg   MCHC 33.6 30.0 - 36.0 g/dL   RDW 56.2 13.0 - 86.5 %   Platelets 185 150 - 400 K/uL   Neutrophils Relative % 74 %   Neutro Abs 7.4 1.7 - 7.7 K/uL   Lymphocytes Relative 21 %   Lymphs Abs 2.1 0.7 - 4.0 K/uL   Monocytes Relative 4 %   Monocytes Absolute 0.4 0.1 - 1.0 K/uL   Eosinophils Relative 1 %   Eosinophils Absolute 0.1 0.0 - 0.7 K/uL   Basophils Relative 0 %   Basophils Absolute 0.0 0.0 - 0.1 K/uL  Comprehensive metabolic panel     Status: Abnormal   Collection Time: 10/20/16 10:13 AM  Result Value Ref Range   Sodium 136 135 - 145 mmol/L   Potassium 3.9 3.5 - 5.1 mmol/L   Chloride 101 101 - 111 mmol/L   CO2 26 22 - 32 mmol/L   Glucose, Bld 80 65 - 99 mg/dL   BUN 8 6 - 20 mg/dL   Creatinine, Ser 7.84 0.44 - 1.00 mg/dL   Calcium 69.6 8.9 - 29.5 mg/dL   Total Protein 7.2 6.5 - 8.1 g/dL   Albumin 3.4 (L) 3.5 - 5.0 g/dL   AST 16 15 - 41 U/L   ALT 14 14 - 54 U/L   Alkaline Phosphatase  54 38 - 126 U/L   Total Bilirubin 0.5 0.3 - 1.2 mg/dL   GFR calc non Af Amer >60 >60 mL/min   GFR calc Af Amer >60 >60 mL/min   Anion gap 9 5 - 15  Pregnancy, urine POC     Status: Abnormal   Collection Time: 10/20/16 10:16 AM  Result Value Ref Range   Preg Test, Ur POSITIVE (A) NEGATIVE   Dg Chest 2 View  Result Date: 10/20/2016 CLINICAL DATA:  Shortness of breath for 1 week EXAM: CHEST  2 VIEW COMPARISON:  03/29/2016 FINDINGS: Heart and mediastinal contours are within normal limits. No focal opacities or effusions. No acute bony abnormality. IMPRESSION: No active cardiopulmonary disease. Electronically Signed   By: Charlett Nose M.D.   On: 10/20/2016 10:34    MDM Reactive NST SpO2 97-100% Chest xray negative Elevated BP, none severe range, CBC & CMP ordered Lung sounds improved & pt reports improvement in symptoms after duoneb tx x 2 S/w Dr. Jackelyn Knife. Ok to discharge home with rx for duoneb. Pt has nebulizer supplies at home & close follow up in office on Monday  Assessment and Plan  A: 1. SOB (shortness of breath)   2. History of asthma   3. [redacted] weeks gestation of pregnancy   4. Preeclampsia, third trimester    P; Discharge home Rx duoneb prn Discussed reasons to return to MAU Keep f/u with ob  Judeth Horn 10/20/2016, 10:39 AM

## 2016-10-20 NOTE — MAU Note (Signed)
Urine in lab 

## 2016-10-20 NOTE — MAU Note (Signed)
+  shortness of breath X 1week; has gotten worse +wheezing  Has been using both inhalers and nebulizer States she feels like she is suffocating  States having twice a week NST for pre-eclampsia; not on any medication for BP

## 2016-10-25 DIAGNOSIS — Z3A33 33 weeks gestation of pregnancy: Secondary | ICD-10-CM | POA: Diagnosis not present

## 2016-10-25 DIAGNOSIS — O1403 Mild to moderate pre-eclampsia, third trimester: Secondary | ICD-10-CM | POA: Diagnosis not present

## 2016-10-25 DIAGNOSIS — O139 Gestational [pregnancy-induced] hypertension without significant proteinuria, unspecified trimester: Secondary | ICD-10-CM | POA: Diagnosis not present

## 2016-10-28 DIAGNOSIS — O139 Gestational [pregnancy-induced] hypertension without significant proteinuria, unspecified trimester: Secondary | ICD-10-CM | POA: Diagnosis not present

## 2016-10-28 DIAGNOSIS — Z3A33 33 weeks gestation of pregnancy: Secondary | ICD-10-CM | POA: Diagnosis not present

## 2016-10-31 DIAGNOSIS — H5202 Hypermetropia, left eye: Secondary | ICD-10-CM | POA: Diagnosis not present

## 2016-11-01 DIAGNOSIS — Z3A34 34 weeks gestation of pregnancy: Secondary | ICD-10-CM | POA: Diagnosis not present

## 2016-11-01 DIAGNOSIS — O139 Gestational [pregnancy-induced] hypertension without significant proteinuria, unspecified trimester: Secondary | ICD-10-CM | POA: Diagnosis not present

## 2016-11-04 DIAGNOSIS — O1403 Mild to moderate pre-eclampsia, third trimester: Secondary | ICD-10-CM | POA: Diagnosis not present

## 2016-11-04 DIAGNOSIS — Z3A34 34 weeks gestation of pregnancy: Secondary | ICD-10-CM | POA: Diagnosis not present

## 2016-11-08 DIAGNOSIS — O139 Gestational [pregnancy-induced] hypertension without significant proteinuria, unspecified trimester: Secondary | ICD-10-CM | POA: Diagnosis not present

## 2016-11-08 DIAGNOSIS — Z3A35 35 weeks gestation of pregnancy: Secondary | ICD-10-CM | POA: Diagnosis not present

## 2016-11-08 DIAGNOSIS — O1493 Unspecified pre-eclampsia, third trimester: Secondary | ICD-10-CM | POA: Diagnosis not present

## 2016-11-11 DIAGNOSIS — O1403 Mild to moderate pre-eclampsia, third trimester: Secondary | ICD-10-CM | POA: Diagnosis not present

## 2016-11-11 DIAGNOSIS — Z3A35 35 weeks gestation of pregnancy: Secondary | ICD-10-CM | POA: Diagnosis not present

## 2016-11-15 DIAGNOSIS — O1403 Mild to moderate pre-eclampsia, third trimester: Secondary | ICD-10-CM | POA: Diagnosis not present

## 2016-11-15 DIAGNOSIS — O139 Gestational [pregnancy-induced] hypertension without significant proteinuria, unspecified trimester: Secondary | ICD-10-CM | POA: Diagnosis not present

## 2016-11-15 DIAGNOSIS — Z3A36 36 weeks gestation of pregnancy: Secondary | ICD-10-CM | POA: Diagnosis not present

## 2016-11-16 ENCOUNTER — Encounter (HOSPITAL_COMMUNITY): Payer: Self-pay | Admitting: *Deleted

## 2016-11-16 ENCOUNTER — Telehealth (HOSPITAL_COMMUNITY): Payer: Self-pay | Admitting: *Deleted

## 2016-11-16 NOTE — Telephone Encounter (Signed)
Preadmission screen  

## 2016-11-18 ENCOUNTER — Inpatient Hospital Stay (HOSPITAL_COMMUNITY)
Admission: AD | Admit: 2016-11-18 | Discharge: 2016-11-21 | DRG: 775 | Disposition: A | Discharge status: Home / Self Care | Payer: BLUE CROSS/BLUE SHIELD | Source: Ambulatory Visit | Attending: Obstetrics and Gynecology | Admitting: Obstetrics and Gynecology

## 2016-11-18 ENCOUNTER — Encounter (HOSPITAL_COMMUNITY): Payer: Self-pay

## 2016-11-18 DIAGNOSIS — Z3A36 36 weeks gestation of pregnancy: Secondary | ICD-10-CM | POA: Diagnosis not present

## 2016-11-18 DIAGNOSIS — K589 Irritable bowel syndrome without diarrhea: Secondary | ICD-10-CM | POA: Diagnosis not present

## 2016-11-18 DIAGNOSIS — O1403 Mild to moderate pre-eclampsia, third trimester: Secondary | ICD-10-CM | POA: Diagnosis not present

## 2016-11-18 DIAGNOSIS — O9952 Diseases of the respiratory system complicating childbirth: Secondary | ICD-10-CM | POA: Diagnosis not present

## 2016-11-18 DIAGNOSIS — O139 Gestational [pregnancy-induced] hypertension without significant proteinuria, unspecified trimester: Secondary | ICD-10-CM | POA: Diagnosis not present

## 2016-11-18 DIAGNOSIS — O1494 Unspecified pre-eclampsia, complicating childbirth: Secondary | ICD-10-CM | POA: Diagnosis not present

## 2016-11-18 DIAGNOSIS — O99824 Streptococcus B carrier state complicating childbirth: Secondary | ICD-10-CM | POA: Diagnosis present

## 2016-11-18 DIAGNOSIS — R519 Headache, unspecified: Secondary | ICD-10-CM

## 2016-11-18 DIAGNOSIS — O133 Gestational [pregnancy-induced] hypertension without significant proteinuria, third trimester: Secondary | ICD-10-CM

## 2016-11-18 DIAGNOSIS — O134 Gestational [pregnancy-induced] hypertension without significant proteinuria, complicating childbirth: Secondary | ICD-10-CM | POA: Diagnosis not present

## 2016-11-18 DIAGNOSIS — R51 Headache: Secondary | ICD-10-CM

## 2016-11-18 DIAGNOSIS — O9962 Diseases of the digestive system complicating childbirth: Secondary | ICD-10-CM | POA: Diagnosis present

## 2016-11-18 DIAGNOSIS — J45909 Unspecified asthma, uncomplicated: Secondary | ICD-10-CM | POA: Diagnosis not present

## 2016-11-18 DIAGNOSIS — Z23 Encounter for immunization: Secondary | ICD-10-CM | POA: Diagnosis not present

## 2016-11-18 LAB — CBC
HCT: 37.8 % (ref 36.0–46.0)
HEMOGLOBIN: 12.7 g/dL (ref 12.0–15.0)
MCH: 30.1 pg (ref 26.0–34.0)
MCHC: 33.6 g/dL (ref 30.0–36.0)
MCV: 89.6 fL (ref 78.0–100.0)
Platelets: 198 10*3/uL (ref 150–400)
RBC: 4.22 MIL/uL (ref 3.87–5.11)
RDW: 15.1 % (ref 11.5–15.5)
WBC: 11.6 10*3/uL — ABNORMAL HIGH (ref 4.0–10.5)

## 2016-11-18 LAB — URINALYSIS, ROUTINE W REFLEX MICROSCOPIC
Bilirubin Urine: NEGATIVE
Glucose, UA: NEGATIVE mg/dL
Hgb urine dipstick: NEGATIVE
Ketones, ur: NEGATIVE mg/dL
Nitrite: NEGATIVE
Protein, ur: NEGATIVE mg/dL
SPECIFIC GRAVITY, URINE: 1.004 — AB (ref 1.005–1.030)
pH: 6 (ref 5.0–8.0)

## 2016-11-18 MED ORDER — LABETALOL HCL 100 MG PO TABS
100.0000 mg | ORAL_TABLET | Freq: Two times a day (BID) | ORAL | Status: DC
  Administered 2016-11-18 – 2016-11-20 (×3): 100 mg via ORAL
  Filled 2016-11-18 (×4): qty 1

## 2016-11-18 MED ORDER — EPHEDRINE 5 MG/ML INJ
10.0000 mg | INTRAVENOUS | Status: DC | PRN
  Filled 2016-11-18: qty 2

## 2016-11-18 MED ORDER — EPHEDRINE 5 MG/ML INJ: 10.0000 mg | INTRAVENOUS | Status: DC | PRN

## 2016-11-18 MED ORDER — ONDANSETRON HCL 4 MG/2ML IJ SOLN
4.0000 mg | Freq: Four times a day (QID) | INTRAMUSCULAR | Status: DC | PRN
  Administered 2016-11-19: 4 mg via INTRAVENOUS
  Filled 2016-11-18: qty 2

## 2016-11-18 MED ORDER — PHENYLEPHRINE 40 MCG/ML (10ML) SYRINGE FOR IV PUSH (FOR BLOOD PRESSURE SUPPORT)
80.0000 ug | PREFILLED_SYRINGE | INTRAVENOUS | Status: DC | PRN
  Filled 2016-11-18: qty 10
  Filled 2016-11-18: qty 5

## 2016-11-18 MED ORDER — OXYTOCIN 40 UNITS IN LACTATED RINGERS INFUSION - SIMPLE MED
INTRAVENOUS | Status: AC
  Filled 2016-11-18: qty 1000

## 2016-11-18 MED ORDER — OXYTOCIN 40 UNITS IN LACTATED RINGERS INFUSION - SIMPLE MED
1.0000 m[IU]/min | INTRAVENOUS | Status: DC
  Administered 2016-11-18: 2 m[IU]/min via INTRAVENOUS

## 2016-11-18 MED ORDER — LACTATED RINGERS IV SOLN
INTRAVENOUS | Status: DC
  Administered 2016-11-19: 125 mL/h via INTRAVENOUS
  Administered 2016-11-19 (×2): via INTRAVENOUS

## 2016-11-18 MED ORDER — LACTATED RINGERS IV SOLN: 500.0000 mL | INTRAVENOUS | Status: DC | PRN

## 2016-11-18 MED ORDER — PENICILLIN G POT IN DEXTROSE 60000 UNIT/ML IV SOLN
3.0000 10*6.[IU] | INTRAVENOUS | Status: DC
  Administered 2016-11-18: 3 10*6.[IU] via INTRAVENOUS
  Filled 2016-11-18 (×4): qty 50

## 2016-11-18 MED ORDER — LACTATED RINGERS IV SOLN
500.0000 mL | Freq: Once | INTRAVENOUS | Status: AC
  Administered 2016-11-19: 500 mL via INTRAVENOUS

## 2016-11-18 MED ORDER — FENTANYL 2.5 MCG/ML BUPIVACAINE 1/10 % EPIDURAL INFUSION (WH - ANES)
14.0000 mL/h | INTRAMUSCULAR | Status: DC | PRN
Start: 2016-11-18 — End: 2016-11-19
  Administered 2016-11-19: 14 mL/h via EPIDURAL
  Filled 2016-11-18: qty 100

## 2016-11-18 MED ORDER — ACETAMINOPHEN 325 MG PO TABS
650.0000 mg | ORAL_TABLET | ORAL | Status: DC | PRN
  Administered 2016-11-18 – 2016-11-19 (×3): 650 mg via ORAL
  Filled 2016-11-18 (×3): qty 2

## 2016-11-18 MED ORDER — OXYTOCIN 40 UNITS IN LACTATED RINGERS INFUSION - SIMPLE MED
2.5000 [IU]/h | INTRAVENOUS | Status: DC
  Filled 2016-11-18: qty 1000

## 2016-11-18 MED ORDER — ALBUTEROL SULFATE (2.5 MG/3ML) 0.083% IN NEBU: 3.0000 mL | INHALATION_SOLUTION | RESPIRATORY_TRACT | Status: DC | PRN

## 2016-11-18 MED ORDER — SOD CITRATE-CITRIC ACID 500-334 MG/5ML PO SOLN
30.0000 mL | ORAL | Status: DC | PRN
  Administered 2016-11-19: 30 mL via ORAL
  Filled 2016-11-18: qty 15

## 2016-11-18 MED ORDER — LIDOCAINE HCL (PF) 1 % IJ SOLN
30.0000 mL | INTRAMUSCULAR | Status: DC | PRN
  Filled 2016-11-18: qty 30

## 2016-11-18 MED ORDER — OXYCODONE-ACETAMINOPHEN 5-325 MG PO TABS: 1.0000 | ORAL_TABLET | ORAL | Status: DC | PRN

## 2016-11-18 MED ORDER — BUTORPHANOL TARTRATE 1 MG/ML IJ SOLN
1.0000 mg | INTRAMUSCULAR | Status: DC | PRN
  Administered 2016-11-18: 1 mg via INTRAVENOUS
  Filled 2016-11-18: qty 1

## 2016-11-18 MED ORDER — DEXTROSE 5 % IV SOLN
5.0000 10*6.[IU] | Freq: Once | INTRAVENOUS | Status: AC
  Administered 2016-11-18: 5 10*6.[IU] via INTRAVENOUS
  Filled 2016-11-18: qty 5

## 2016-11-18 MED ORDER — TERBUTALINE SULFATE 1 MG/ML IJ SOLN
0.2500 mg | Freq: Once | INTRAMUSCULAR | Status: DC | PRN
  Filled 2016-11-18: qty 1

## 2016-11-18 MED ORDER — LABETALOL HCL 5 MG/ML IV SOLN
INTRAVENOUS | Status: AC
  Filled 2016-11-18: qty 4

## 2016-11-18 MED ORDER — OXYTOCIN BOLUS FROM INFUSION
500.0000 mL | Freq: Once | INTRAVENOUS | Status: AC
Start: 2016-11-18 — End: 2016-11-19
  Administered 2016-11-19: 500 mL via INTRAVENOUS

## 2016-11-18 MED ORDER — BETAMETHASONE SOD PHOS & ACET 6 (3-3) MG/ML IJ SUSP
12.0000 mg | Freq: Once | INTRAMUSCULAR | Status: AC
Start: 2016-11-18 — End: 2016-11-18
  Administered 2016-11-18: 12 mg via INTRAMUSCULAR
  Filled 2016-11-18: qty 2

## 2016-11-18 MED ORDER — IPRATROPIUM-ALBUTEROL 0.5-2.5 (3) MG/3ML IN SOLN
3.0000 mL | RESPIRATORY_TRACT | Status: DC | PRN
Start: 2016-11-18 — End: 2016-11-21
  Filled 2016-11-18: qty 3

## 2016-11-18 MED ORDER — OXYCODONE-ACETAMINOPHEN 5-325 MG PO TABS: 2.0000 | ORAL_TABLET | ORAL | Status: DC | PRN

## 2016-11-18 MED ORDER — LABETALOL HCL 5 MG/ML IV SOLN
20.0000 mg | INTRAVENOUS | Status: DC | PRN
  Administered 2016-11-18: 20 mg via INTRAVENOUS

## 2016-11-18 MED ORDER — DIPHENHYDRAMINE HCL 50 MG/ML IJ SOLN: 12.5000 mg | INTRAMUSCULAR | Status: DC | PRN

## 2016-11-18 MED ORDER — IPRATROPIUM-ALBUTEROL 0.5-2.5 (3) MG/3ML IN SOLN
3.0000 mL | RESPIRATORY_TRACT | Status: DC
  Administered 2016-11-18: 3 mL via RESPIRATORY_TRACT
  Filled 2016-11-18 (×5): qty 3

## 2016-11-18 MED ORDER — HYDRALAZINE HCL 20 MG/ML IJ SOLN: 10.0000 mg | Freq: Once | INTRAMUSCULAR | Status: DC | PRN

## 2016-11-18 MED ORDER — MOMETASONE FURO-FORMOTEROL FUM 200-5 MCG/ACT IN AERO
2.0000 | INHALATION_SPRAY | Freq: Two times a day (BID) | RESPIRATORY_TRACT | Status: DC
  Administered 2016-11-18 – 2016-11-21 (×5): 2 via RESPIRATORY_TRACT
  Filled 2016-11-18: qty 8.8

## 2016-11-18 NOTE — MAU Note (Signed)
Patient was seen by Dr. Senaida Oresichardson today, has had some problems with elevated blood pressures, had lab work done today and was called by MD to come to MAU for further evaluation.

## 2016-11-18 NOTE — H&P (Signed)
Andrea Boyer is a 36 y.o. female presenting for scheduled iol for preE. Pt was noted to have labile BPs and proteinuria in July. She has been asymptomatic till today when begun complaining of persistent HA unresolved with fioricet. On arrival at MAU BPs were consistently elevated hence pt admitted for expectant labor mgmt. She is s/p BMZ course last month. She is GBS positive. She is dated based on LMP and confirmed with a first trimester [place].  She has had some respiratory difficulty due to her asthma during pregnancy and an umbilical cyst that resolved. Recent preE labs today were wnl. She is A neg  OB History    Gravida Para Term Preterm AB Living   2 1 1     1    SAB TAB Ectopic Multiple Live Births           1     Past Medical History:  Diagnosis Date  . Anxiety    history - no current problems  . Asthma    exercise induced rarely uses inhaler  . Elevated prolactin level (HCC) 10/2006   NEGATIVE MRI  . History of kidney stones    passes stones - no surgery required  . IBS (irritable bowel syndrome)   . PIH (pregnancy induced hypertension) 06/2011   PIH resolved after delivery   . Pneumonia 2010  . PONV (postoperative nausea and vomiting)   . Pregnancy induced hypertension   . SVD (spontaneous vaginal delivery)    x 1   Past Surgical History:  Procedure Laterality Date  . FOOT FRACTURE SURGERY Right 2004   removed a bone from a fx foot  . LAPAROSCOPIC LYSIS OF ADHESIONS N/A 11/29/2012   Procedure: LAPAROSCOPIC LYSIS OF ADHESIONS;  Surgeon: Oliver Pila, MD;  Location: [facility];  Service: Gynecology;  Laterality: N/A;  . LAPAROSCOPY N/A 11/29/2012   Procedure: LAPAROSCOPY OPERATIVE;  Surgeon: Oliver Pila, MD;  Location: [facility];  Service: Gynecology;  Laterality: N/A;  . TONSILLECTOMY  1991  . WISDOM TOOTH EXTRACTION     Family History: family history includes Arthritis in her maternal grandmother and mother; COPD in her maternal grandmother; Colon polyps in her father  and mother; Diabetes in her father, maternal grandmother, and paternal aunt; Heart disease in her maternal grandmother; Hypertension in her father, maternal grandfather, maternal grandmother, mother, paternal grandfather, and paternal grandmother; Lung cancer in her paternal grandmother; Muscular dystrophy in her cousin; Ovarian cysts in her maternal grandmother; Stomach cancer in her mother and paternal grandfather. Social History:  reports that she has never smoked. She has never used smokeless tobacco. She reports that she does not drink alcohol or use drugs.     Maternal Diabetes: No Genetic Screening: Declined Maternal Ultrasounds/Referrals: Normal Fetal Ultrasounds or other Referrals:  None Maternal Substance Abuse:  No Significant Maternal Medications:  Meds include: Other: symbicort and nebulizer Significant Maternal Lab Results:  Lab values include: Group B Strep positive Other Comments:  None  Review of Systems  Constitutional: Negative for chills, fever and malaise/fatigue.  Eyes: Negative for blurred vision.  Respiratory: Negative for shortness of breath.   Cardiovascular: Negative for chest pain.  Gastrointestinal: Negative for abdominal pain, heartburn, nausea and vomiting.  Genitourinary: Negative for dysuria.  Musculoskeletal: Negative for neck pain.  Skin: Negative for rash.  Neurological: Positive for headaches.  Psychiatric/Behavioral: Negative for depression, hallucinations, substance abuse and suicidal ideas. The patient is not nervous/anxious.    Maternal Medical History:  Reason for admission: Nausea. iol for  preE  Contractions: Frequency: rare.   Perceived severity is mild.    Fetal activity: Perceived fetal activity is normal.   Last perceived fetal movement was within the past hour.    Prenatal complications: Pre-eclampsia.   Prenatal Complications - Diabetes: none.    Dilation: 1 Effacement (%): 50 Station: Ballotable Exam by:: m wilkins rnc  (ultrasound) Blood pressure (!) 148/92, pulse (!) 106, temperature 98.6 F (37 C), resp. rate 16, height 5\' 4"  (1.626 m), weight 187 lb (84.8 kg). Maternal Exam:  Uterine Assessment: Contraction strength is mild.  Contraction frequency is rare.   Abdomen: Patient reports no abdominal tenderness. Estimated fetal weight is AGA.   Fetal presentation: vertex  Introitus: Normal vulva. Vulva is negative for condylomata and lesion.  Normal vagina.  Vagina is negative for condylomata and discharge.  Pelvis: adequate for delivery.   Cervix: Cervix evaluated by digital exam.     Physical Exam  Constitutional: She is oriented to person, place, and time. She appears well-developed and well-nourished.  HENT:  Head: Normocephalic.  Neck: Normal range of motion.  Cardiovascular: Normal rate.   Respiratory: Effort normal.  GI: Soft.  Genitourinary: Vagina normal and uterus normal. Vulva exhibits no lesion. No vaginal discharge found.  Musculoskeletal: Normal range of motion. She exhibits no edema.  Neurological: She is alert and oriented to person, place, and time.  Skin: Skin is warm.  Psychiatric: She has a normal mood and affect. Her behavior is normal. Judgment and thought content normal.    Prenatal labs: ABO, Rh: --/--/A NEG (08/31 1752) Antibody: POS (08/31 1752) Rubella: Immune (02/23 0000) RPR: Nonreactive (02/23 0000)  HBsAg: Negative (02/23 0000)  HIV: Non-reactive (02/23 0000)  GBS:     Assessment/Plan: G2P1001 at 36 3/[redacted]wks gestation with preE Admit for iol with pitocin Labetalol protocol; labetalol 100mg  po BID PCN for GBS positive AROM after two doses of PCN when able S/p BMZ 7/3 and 7/4 - consider booster Anticipate svd   Erionna Strum W Asuncion Tapscott 11/18/2016, 7:39 PM

## 2016-11-18 NOTE — Progress Notes (Addendum)
Patient ID: Andrea Boyer, female   DOB: 1980/11/19, 36 y.o.   MRN: [id number] Pt doing well. She is starting to appreciate some contractions but tolerable now. Headache improved. +fms.  VS 148/89 )( s/p one dose labetalol iv) EFM - 150, cat 1 TOCO - rare contractions SVE - 2-3/60/-3  A/P: G2P1001 at 36 3/7wks progressing on pitocin for iol         Pending labetalol 100mg  and BMZ         AROM after second dose PCN         Pain control prn         Anticipate svd

## 2016-11-18 NOTE — MAU Provider Note (Signed)
Chief Complaint:  Headache and Hypertension   First Provider Initiated Contact with Patient 11/18/16 1644     HPI: Andrea Boyer is a 36 y.o. G2P1001 at 35w3dwho presents to maternity admissions reporting headache and hyptertension.  Had labs done in office and was told they were normal   Does have some visual floaters off and on, none today.. She reports good fetal movement, denies LOF, vaginal bleeding, vaginal itching/burning, urinary symptoms,  dizziness, n/v, diarrhea, constipation or fever/chills.  She denies visual changes or RUQ abdominal pain.  Headache   This is a recurrent problem. The current episode started in the past 7 days. The problem occurs constantly. The problem has been unchanged. The pain is located in the frontal region. The pain does not radiate. The pain quality is similar to prior headaches. The quality of the pain is described as aching. Pertinent negatives include no abdominal pain, back pain, blurred vision, dizziness, fever, nausea, photophobia, seizures, visual change or vomiting. Nothing aggravates the symptoms. She has tried nothing for the symptoms. Her past medical history is significant for hypertension.  Hypertension  This is a new problem. The current episode started today. The problem is unchanged. Associated symptoms include headaches. Pertinent negatives include no anxiety, blurred vision or peripheral edema. There are no associated agents to hypertension. Past treatments include nothing. There are no compliance problems.     RN Note: Patient was seen by Dr. Senaida Ores today, has had some problems with elevated blood pressures, had lab work done today and was called by MD to come to MAU for further evaluation.   Past Medical History: Past Medical History:  Diagnosis Date  . Anxiety    history - no current problems  . Asthma    exercise induced rarely uses inhaler  . Elevated prolactin level (HCC) 10/2006   NEGATIVE MRI  . History of kidney stones    passes stones - no surgery required  . IBS (irritable bowel syndrome)   . PIH (pregnancy induced hypertension) 06/2011   PIH resolved after delivery   . Pneumonia 2010  . PONV (postoperative nausea and vomiting)   . Pregnancy induced hypertension   . SVD (spontaneous vaginal delivery)    x 1    Past obstetric history: OB History  Gravida Para Term Preterm AB Living  2 1 1     1   SAB TAB Ectopic Multiple Live Births          1    # Outcome Date GA Lbr Len/2nd Weight Sex Delivery Anes PTL Lv  2 Current           1 Term 06/27/11 [redacted]w[redacted]d 11:06 / 01:12 7 lb 14 oz (3.572 kg) M Vag-Spont EPI  LIV      Past Surgical History: Past Surgical History:  Procedure Laterality Date  . FOOT FRACTURE SURGERY Right 2004   removed a bone from a fx foot  . LAPAROSCOPIC LYSIS OF ADHESIONS N/A 11/29/2012   Procedure: LAPAROSCOPIC LYSIS OF ADHESIONS;  Surgeon: Oliver Pila, MD;  Location: [facility];  Service: Gynecology;  Laterality: N/A;  . LAPAROSCOPY N/A 11/29/2012   Procedure: LAPAROSCOPY OPERATIVE;  Surgeon: Oliver Pila, MD;  Location: [facility];  Service: Gynecology;  Laterality: N/A;  . TONSILLECTOMY  1991  . WISDOM TOOTH EXTRACTION      Family History: Family History  Problem Relation Age of Onset  . Hypertension Mother   . Arthritis Mother   . Colon polyps Mother   . Stomach cancer  Mother   . Diabetes Father   . Hypertension Father   . Colon polyps Father   . Diabetes Maternal Grandmother   . COPD Maternal Grandmother   . Hypertension Maternal Grandmother   . Ovarian cysts Maternal Grandmother   . Arthritis Maternal Grandmother   . Heart disease Maternal Grandmother   . Hypertension Maternal Grandfather   . Hypertension Paternal Grandmother   . Lung cancer Paternal Grandmother   . Hypertension Paternal Grandfather   . Stomach cancer Paternal Grandfather   . Muscular dystrophy Cousin   . Diabetes Paternal Aunt   . Anesthesia problems Neg Hx   . Depression Neg Hx   .  Asthma Neg Hx     Social History: Social History  Substance Use Topics  . Smoking status: Never Smoker  . Smokeless tobacco: Never Used  . Alcohol use No    Allergies: No Known Allergies  Meds:  Prescriptions Prior to Admission  Medication Sig Dispense Refill Last Dose  . acetaminophen (TYLENOL) 500 MG tablet Take 500 mg by mouth every 6 (six) hours as needed for mild pain or headache.   10/19/2016 at Unknown time  . albuterol (PROVENTIL) (2.5 MG/3ML) 0.083% nebulizer solution Take 3 mLs (2.5 mg total) by nebulization every 6 (six) hours as needed for wheezing or shortness of breath. 360 mL 1 10/20/2016 at Unknown time  . Albuterol Sulfate (PROAIR RESPICLICK) 108 (90 Base) MCG/ACT AEPB Inhale 2 puffs into the lungs every 4 (four) hours as needed. (Patient taking differently: Inhale 2 puffs into the lungs every 4 (four) hours as needed (for shortness of breath/asthma). ) 1 each 2 10/20/2016 at Unknown time  . budesonide-formoterol (SYMBICORT) 160-4.5 MCG/ACT inhaler Inhale 2 puffs into the lungs 2 (two) times daily. (Patient taking differently: Inhale 2 puffs into the lungs 2 (two) times daily as needed (for shortness of breath/asthma). ) 1 Inhaler 11 10/20/2016 at Unknown time  . butalbital-acetaminophen-caffeine (FIORICET, ESGIC) 50-325-40 MG tablet Take 2 tablets by mouth every 4 (four) hours as needed for headache or migraine.    10/19/2016 at Unknown time  . ipratropium-albuterol (DUONEB) 0.5-2.5 (3) MG/3ML SOLN Take 3 mLs by nebulization every 4 (four) hours as needed. 360 mL 0   . ondansetron (ZOFRAN-ODT) 4 MG disintegrating tablet Take 1 tablet by mouth every 4 (four) hours as needed.   prn  . Prenatal Vit-Fe Fumarate-FA (PRENATAL MULTIVITAMIN) TABS tablet Take 1 tablet by mouth at bedtime.   10/19/2016 at Unknown time  . ranitidine (ZANTAC) 150 MG tablet Take 150 mg by mouth 2 (two) times daily.   10/20/2016 at Unknown time    I have reviewed patient's Past Medical Hx, Surgical Hx, Family Hx,  Social Hx, medications and allergies.   ROS:  Review of Systems  Constitutional: Negative for fever.  Eyes: Negative for blurred vision and photophobia.  Gastrointestinal: Negative for abdominal pain, nausea and vomiting.  Musculoskeletal: Negative for back pain.  Neurological: Positive for headaches. Negative for dizziness and seizures.   Other systems negative  Physical Exam  Patient Vitals for the past 24 hrs:  BP Temp Pulse Resp Height Weight  11/18/16 1700 (!) 144/98 - 98 - - -  11/18/16 1645 (!) 162/99 - (!) 105 - - -  11/18/16 1638 (!) 152/94 - (!) 109 - - -  11/18/16 1637 (!) 152/94 98.6 F (37 C) (!) 109 16 - -  11/18/16 1632 - - - - 5\' 4"  (1.626 m) 187 lb (84.8 kg)   Constitutional: Well-developed,  well-nourished female in no acute distress.  Cardiovascular: normal rate and rhythm Respiratory: normal effort, clear to auscultation bilaterally GI: Abd soft, non-tender, gravid appropriate for gestational age.   No rebound or guarding. MS: Extremities nontender, no edema, normal ROM Neurologic: Alert and oriented x 4.  GU: Neg CVAT.  PELVIC EXAM:   Dilation: 1 Effacement (%): 50 Cervical Position: Posterior Station: Ballotable Presentation: Vertex Exam by:: Mayford KnifeWilliams CNM  FHT:  Baseline 140 , moderate variability, accelerations present, no decelerations Contractions: q 2-3 mins Irregular    Labs: No results found for this or any previous visit (from the past 24 hour(s)).  A/Negative/-- (02/23 0000)  Imaging:  Dg Chest 2 View  Result Date: 10/20/2016 CLINICAL DATA:  Shortness of breath for 1 week EXAM: CHEST  2 VIEW COMPARISON:  03/29/2016 FINDINGS: Heart and mediastinal contours are within normal limits. No focal opacities or effusions. No acute bony abnormality. IMPRESSION: No active cardiopulmonary disease. Electronically Signed   By: Charlett NoseKevin  Dover M.D.   On: 10/20/2016 10:34    MAU Course/MDM: I have not ordered labs since they were done in office  NST  reviewed Consult Dr Mindi SlickerBanga with presentation, exam findings and test results.  Treatments in MAU included EFM.    Assessment: Single IUP at 3285w3d Gestational hypertension with some severe range pressures  [facility]: [facility] Mindi SlickerBanga Routine orders Had Betamethasone at July 3-4  Wynelle BourgeoisMarie Montine Hight CNM, MSN Certified Nurse-Midwife 11/18/2016 5:07 PM

## 2016-11-18 NOTE — Anesthesia Pain Management Evaluation Note (Signed)
  CRNA Pain Management Visit Note  Patient: Andrea Boyer, 36 y.o., female  "Hello I am a member of the anesthesia team at [facility]. We have an anesthesia team available at all times to provide care throughout the hospital, including epidural management and anesthesia for C-section. I don't know your plan for the delivery whether it a natural birth, water birth, IV sedation, nitrous supplementation, doula or epidural, but we want to meet your pain goals."   1.Was your pain managed to your expectations on prior hospitalizations?   Yes   2.What is your expectation for pain management during this hospitalization?     Epidural  3.How can we help you reach that goal? Epidural when patient is in established labor.  Record the patient's initial score and the patient's pain goal.   Pain: 3  Pain Goal: 5 [facility] wants you to be able to say your pain was always managed very well.  Rogue Rafalski 11/18/2016

## 2016-11-18 NOTE — Progress Notes (Deleted)
Patient ID: [name], female   DOB: March 27, 1980, 36 y.o.   MRN: [id number]

## 2016-11-19 ENCOUNTER — Inpatient Hospital Stay (HOSPITAL_COMMUNITY): Payer: BLUE CROSS/BLUE SHIELD | Admitting: Anesthesiology

## 2016-11-19 ENCOUNTER — Encounter (HOSPITAL_COMMUNITY): Payer: Self-pay | Admitting: Obstetrics and Gynecology

## 2016-11-19 LAB — CBC
HCT: 35.6 % — ABNORMAL LOW (ref 36.0–46.0)
Hemoglobin: 11.8 g/dL — ABNORMAL LOW (ref 12.0–15.0)
MCH: 29.8 pg (ref 26.0–34.0)
MCHC: 33.1 g/dL (ref 30.0–36.0)
MCV: 89.9 fL (ref 78.0–100.0)
PLATELETS: 191 10*3/uL (ref 150–400)
RBC: 3.96 MIL/uL (ref 3.87–5.11)
RDW: 15 % (ref 11.5–15.5)
WBC: 11.6 10*3/uL — ABNORMAL HIGH (ref 4.0–10.5)

## 2016-11-19 LAB — RPR: RPR: NONREACTIVE

## 2016-11-19 LAB — HIV ANTIBODY (ROUTINE TESTING W REFLEX): HIV Screen 4th Generation wRfx: NONREACTIVE

## 2016-11-19 MED ORDER — COCONUT OIL OIL: 1.0000 "application " | TOPICAL_OIL | Status: DC | PRN

## 2016-11-19 MED ORDER — IBUPROFEN 600 MG PO TABS
600.0000 mg | ORAL_TABLET | Freq: Four times a day (QID) | ORAL | Status: DC
  Administered 2016-11-19 – 2016-11-21 (×8): 600 mg via ORAL
  Filled 2016-11-19 (×9): qty 1

## 2016-11-19 MED ORDER — ACETAMINOPHEN 325 MG PO TABS: 650.0000 mg | ORAL_TABLET | ORAL | Status: DC | PRN

## 2016-11-19 MED ORDER — PRENATAL MULTIVITAMIN CH
1.0000 | ORAL_TABLET | Freq: Every day | ORAL | Status: DC
  Administered 2016-11-20 – 2016-11-21 (×2): 1 via ORAL
  Filled 2016-11-19 (×2): qty 1

## 2016-11-19 MED ORDER — WITCH HAZEL-GLYCERIN EX PADS: 1.0000 "application " | MEDICATED_PAD | CUTANEOUS | Status: DC | PRN

## 2016-11-19 MED ORDER — PENICILLIN G POT IN DEXTROSE 60000 UNIT/ML IV SOLN
3.0000 10*6.[IU] | INTRAVENOUS | Status: DC
  Administered 2016-11-19 (×3): 3 10*6.[IU] via INTRAVENOUS
  Filled 2016-11-19 (×6): qty 50

## 2016-11-19 MED ORDER — ZOLPIDEM TARTRATE 5 MG PO TABS: 5.0000 mg | ORAL_TABLET | Freq: Every evening | ORAL | Status: DC | PRN

## 2016-11-19 MED ORDER — OXYCODONE HCL 5 MG PO TABS: 5.0000 mg | ORAL_TABLET | ORAL | Status: DC | PRN

## 2016-11-19 MED ORDER — SIMETHICONE 80 MG PO CHEW: 80.0000 mg | CHEWABLE_TABLET | ORAL | Status: DC | PRN

## 2016-11-19 MED ORDER — TETANUS-DIPHTH-ACELL PERTUSSIS 5-2.5-18.5 LF-MCG/0.5 IM SUSP: 0.5000 mL | Freq: Once | INTRAMUSCULAR | Status: DC

## 2016-11-19 MED ORDER — DIBUCAINE 1 % RE OINT: 1.0000 "application " | TOPICAL_OINTMENT | RECTAL | Status: DC | PRN

## 2016-11-19 MED ORDER — PENICILLIN G POT IN DEXTROSE 60000 UNIT/ML IV SOLN
3.0000 10*6.[IU] | INTRAVENOUS | Status: DC
  Filled 2016-11-19 (×2): qty 50

## 2016-11-19 MED ORDER — ONDANSETRON HCL 4 MG PO TABS: 4.0000 mg | ORAL_TABLET | ORAL | Status: DC | PRN

## 2016-11-19 MED ORDER — SENNOSIDES-DOCUSATE SODIUM 8.6-50 MG PO TABS
2.0000 | ORAL_TABLET | ORAL | Status: DC
  Administered 2016-11-20 – 2016-11-21 (×2): 2 via ORAL
  Filled 2016-11-19 (×2): qty 2

## 2016-11-19 MED ORDER — CYCLOBENZAPRINE HCL 5 MG PO TABS
5.0000 mg | ORAL_TABLET | Freq: Three times a day (TID) | ORAL | Status: DC
  Filled 2016-11-19 (×6): qty 1

## 2016-11-19 MED ORDER — BUTALBITAL-APAP-CAFFEINE 50-325-40 MG PO TABS: 1.0000 | ORAL_TABLET | ORAL | Status: DC | PRN

## 2016-11-19 MED ORDER — OXYCODONE HCL 5 MG PO TABS: 10.0000 mg | ORAL_TABLET | ORAL | Status: DC | PRN

## 2016-11-19 MED ORDER — OXYTOCIN 40 UNITS IN LACTATED RINGERS INFUSION - SIMPLE MED
1.0000 m[IU]/min | INTRAVENOUS | Status: DC
  Administered 2016-11-19: 2 m[IU]/min via INTRAVENOUS

## 2016-11-19 MED ORDER — BENZOCAINE-MENTHOL 20-0.5 % EX AERO: 1.0000 "application " | INHALATION_SPRAY | CUTANEOUS | Status: DC | PRN

## 2016-11-19 MED ORDER — MISOPROSTOL 25 MCG QUARTER TABLET
25.0000 ug | ORAL_TABLET | ORAL | Status: DC
  Administered 2016-11-19: 25 ug via VAGINAL
  Filled 2016-11-19: qty 1

## 2016-11-19 MED ORDER — DIPHENHYDRAMINE HCL 25 MG PO CAPS: 25.0000 mg | ORAL_CAPSULE | Freq: Four times a day (QID) | ORAL | Status: DC | PRN

## 2016-11-19 MED ORDER — ONDANSETRON HCL 4 MG/2ML IJ SOLN: 4.0000 mg | INTRAMUSCULAR | Status: DC | PRN

## 2016-11-19 MED ORDER — LIDOCAINE HCL (PF) 1 % IJ SOLN
INTRAMUSCULAR | Status: DC | PRN
  Administered 2016-11-19 (×2): 5 mL via EPIDURAL

## 2016-11-19 NOTE — Anesthesia Postprocedure Evaluation (Signed)
Anesthesia Post Note  Patient: Andrea Boyer  Procedure(s) Performed: * No procedures listed *     Patient location during evaluation: Mother Baby Anesthesia Type: Epidural Level of consciousness: awake and alert Pain management: pain level controlled Vital Signs Assessment: post-procedure vital signs reviewed and stable Respiratory status: spontaneous breathing, nonlabored ventilation and respiratory function stable Cardiovascular status: stable Postop Assessment: no headache, no backache and epidural receding Anesthetic complications: no    Last Vitals:  Vitals:   11/19/16 1930 11/19/16 1948  BP: 131/89 (!) 144/80  Pulse: 93 (!) 105  Resp: 18   Temp: 37.3 C     Last Pain:  Vitals:   11/19/16 1945  TempSrc:   PainSc: 2    Pain Goal: Patients Stated Pain Goal: 3 (11/18/16 1637)               Jaiyana Canale

## 2016-11-19 NOTE — Progress Notes (Signed)
Patient ID: Andrea Boyer, female   DOB: 15-Nov-1980, 36 y.o.   MRN: [id number] Pt is doing well but headache back. +FMs VS 122-140/57-85 EFM - cat 1, occ variable, 150 TOCO - irreg contractions SVE - c/c/+1  A:Complete dil; stble BP with preE; +HA P: Start pushing     Anticipate svd

## 2016-11-19 NOTE — Progress Notes (Signed)
RN told Dr Mindi SlickerBanga that pt was 3/50/-3; cytotec placed at 0110; contractions are irregular; pt still has a headache; the stadol worked well, but the pt did not like the side effects=dizziness and nausea, so pt given more tylenol and given an ice pack for her head. Pt said that the tylenol and ice pack is really helping; headache still there, but tolerable... Dr Mindi SlickerBanga plans to come and assess pt at 0500

## 2016-11-19 NOTE — Progress Notes (Addendum)
Patient ID: Andrea Boyer, female   DOB: 1980/11/29, 36 y.o.   MRN: [id number] Pt doing well. She got epidural for pain control and contractions now better tolerated.  VSS EFM - cat 1, 135 TOCO - ctxs q 1-223mins SVE - 5/80/-3  A/P: Progressing well in labor on pitocin;          BP stable on labetalol         S/p epidural         Continue PCN for GBS tx         AROM performed with copious clear fluid noted         Anticipate svd

## 2016-11-19 NOTE — Anesthesia Preprocedure Evaluation (Signed)
Anesthesia Evaluation  Patient identified by MRN, date of birth, ID band Patient awake    Reviewed: Allergy & Precautions, H&P , NPO status , Patient's Chart, lab work & pertinent test results, reviewed documented beta blocker date and time   Airway Mallampati: II  TM Distance: >3 FB Neck ROM: full    Dental no notable dental hx.    Pulmonary neg pulmonary ROS,    Pulmonary exam normal breath sounds clear to auscultation       Cardiovascular hypertension, negative cardio ROS Normal cardiovascular exam Rhythm:regular Rate:Normal     Neuro/Psych negative neurological ROS  negative psych ROS   GI/Hepatic negative GI ROS, Neg liver ROS,   Endo/Other  negative endocrine ROS  Renal/GU negative Renal ROS  negative genitourinary   Musculoskeletal   Abdominal   Peds  Hematology negative hematology ROS (+)   Anesthesia Other Findings   Reproductive/Obstetrics (+) Pregnancy                             Anesthesia Physical Anesthesia Plan  ASA: II  Anesthesia Plan: Epidural   Post-op Pain Management:    Induction:   PONV Risk Score and Plan:   Airway Management Planned:   Additional Equipment:   Intra-op Plan:   Post-operative Plan:   Informed Consent: I have reviewed the patients History and Physical, chart, labs and discussed the procedure including the risks, benefits and alternatives for the proposed anesthesia with the patient or authorized representative who has indicated his/her understanding and acceptance.       Plan Discussed with:   Anesthesia Plan Comments:         Anesthesia Quick Evaluation  

## 2016-11-19 NOTE — Progress Notes (Addendum)
Patient ID: Andrea OchoaBrooke Boyer, female   DOB: Aug 17, 1980, 36 y.o.   MRN: [id number] Pt doing well.Migraine improved with stadol but did not like how she felt on it - drowsy. She is appreciating few contractions and +FMs. No LOF VS 129-133/71-75 EFM- 135, cat 1  TOCO - ctxs 1-4 SVE - 4/60/-3 US- confirmed vertex  Progressing in labor s/p pitocin then cytotec Plan to restart pitocin per protocol Will also restart antibx for GBS tx Pain control prn- may have epidural Expectant mgmt

## 2016-11-19 NOTE — Anesthesia Procedure Notes (Signed)
Epidural Patient location during procedure: OB Start time: 11/19/2016 11:55 AM End time: 11/19/2016 12:09 PM  Staffing Anesthesiologist: Shawntez Dickison  Preanesthetic Checklist Completed: patient identified, site marked, surgical consent, pre-op evaluation, timeout performed, IV checked, risks and benefits discussed and monitors and equipment checked  Epidural Patient position: sitting Prep: site prepped and draped and DuraPrep Patient monitoring: continuous pulse ox and blood pressure Approach: midline Location: L3-L4 Injection technique: LOR air  Needle:  Needle type: Tuohy  Needle gauge: 17 G Needle length: 9 cm and 9 Needle insertion depth: 6 cm Catheter type: closed end flexible Catheter size: 19 Gauge Catheter at skin depth: 11 cm Test dose: negative  Assessment Events: blood not aspirated, injection not painful, no injection resistance, negative IV test and no paresthesia

## 2016-11-20 ENCOUNTER — Encounter (HOSPITAL_COMMUNITY): Payer: Self-pay | Admitting: *Deleted

## 2016-11-20 LAB — CBC
HCT: 30.1 % — ABNORMAL LOW (ref 36.0–46.0)
HEMOGLOBIN: 9.9 g/dL — AB (ref 12.0–15.0)
MCH: 30.5 pg (ref 26.0–34.0)
MCHC: 32.9 g/dL (ref 30.0–36.0)
MCV: 92.6 fL (ref 78.0–100.0)
Platelets: 189 10*3/uL (ref 150–400)
RBC: 3.25 MIL/uL — ABNORMAL LOW (ref 3.87–5.11)
RDW: 15.3 % (ref 11.5–15.5)
WBC: 12.4 10*3/uL — ABNORMAL HIGH (ref 4.0–10.5)

## 2016-11-20 MED ORDER — RHO D IMMUNE GLOBULIN 1500 UNIT/2ML IJ SOSY
300.0000 ug | PREFILLED_SYRINGE | Freq: Once | INTRAMUSCULAR | Status: AC
Start: 2016-11-20 — End: 2016-11-20
  Administered 2016-11-20: 300 ug via INTRAVENOUS
  Filled 2016-11-20: qty 2

## 2016-11-20 NOTE — Lactation Note (Signed)
This note was copied from a baby's chart. Lactation Consultation Note LPI 5.13Lbs  36 4/[redacted] weeks gestation. Had low blood sugars at delivery 34, then 43. Will have f/u repeated. No jitteriness noted when unwrapped for temperature. Noted baby lifting legs frequently while wrapped. Parents stating wondering why he was doing that. Temperature taken 98.3. Mom BF her now 89 yr old for 8 months. Mom had to supplement at the end d/t working and milk supply decreasing w/baby appetite increasing. Mom has 3 fingers wide space between tubular breast. Mom stated she had enough BM for her 1st child up until 7 months and baby had big appetite. No issues w/weight loss.   LPI policy information reviewed. Mom in agreement to providing what is needed for baby care.  Hand expressed breast w/few drops of colostrum. Mom shown how to use DEBP & how to disassemble, clean, & reassemble parts. Mom pumped 15 min. Noted colostrum smeared on flanges. Mom knows to pump q3h for 15-20 min. To supplement baby and for breast stimulation. Will use colostrum first to supplement then followed w/formula to equal amount needed. Mom encouraged to feed baby 8-12 times/24 hours and with feeding cues. Mom knows to wake baby if hasn't woke in 3 hrs.   [facility]/LC brochure given w/resources, support groups and LC services.  Patient Name: Andrea Pryor OchoaBrooke Val ZOXWR'UToday's Boyer: 11/20/2016 Reason for consult: Initial assessment   Maternal Data Has patient been taught Hand Expression?: Yes  Feeding Feeding Type: Breast Milk Length of feed: 15 min (per mom)  LATCH Score       Type of Nipple: Everted at rest and after stimulation  Comfort (Breast/Nipple): Soft / non-tender        Interventions Interventions: Breast feeding basics reviewed;Support pillows;Assisted with latch;Position options;Skin to skin;Breast massage;Expressed milk;Hand express;Shells;Hand pump;DEBP;Breast compression;Adjust position  Lactation Tools Discussed/Used Tools:  Pump Breast pump type: Double-Electric Breast Pump WIC Program: No Pump Review: Setup, frequency, and cleaning;Milk Storage Initiated by:: Peri JeffersonL. Aryeh Butterfield RN IBCLC Boyer initiated:: 11/20/16   Consult Status Consult Status: Follow-up Boyer: 11/20/16 Follow-up type: In-patient    Rion Schnitzer, Diamond NickelLAURA G 11/20/2016, 2:13 AM

## 2016-11-20 NOTE — Progress Notes (Signed)
MOB was referred for history of depression/anxiety. * Referral screened out by Clinical Social Worker because none of the following criteria appear to apply: ~ History of anxiety/depression during this pregnancy, or of post-partum depression. ~ Diagnosis of anxiety and/or depression within last 3 years OR * MOB's symptoms currently being treated with medication and/or therapy. Please contact the Clinical Social Worker if needs arise, by MOB request, or if MOB scores 9 or greater/yes to question 10 on Edinburgh Postpartum Depression Screen. 

## 2016-11-20 NOTE — Addendum Note (Signed)
Addendum  created 11/20/16 0817 by Shanon PayorGregory, Ovie Eastep M, CRNA   Charge Capture section accepted, Sign clinical note

## 2016-11-20 NOTE — Anesthesia Postprocedure Evaluation (Signed)
Anesthesia Post Note  Patient: Andrea Boyer  Procedure(s) Performed: * No procedures listed *     Patient location during evaluation: Mother Baby Anesthesia Type: Epidural Level of consciousness: awake and alert and oriented Pain management: satisfactory to patient Vital Signs Assessment: post-procedure vital signs reviewed and stable Respiratory status: spontaneous breathing and nonlabored ventilation Cardiovascular status: stable Postop Assessment: no headache, no backache, no signs of nausea or vomiting, adequate PO intake and patient able to bend at knees (patient up walking) Anesthetic complications: no    Last Vitals:  Vitals:   11/19/16 2215 11/20/16 0230  BP:  124/73  Pulse: (!) 106 90  Resp: 18 18  Temp: 37.1 C 36.6 C    Last Pain:  Vitals:   11/20/16 0710  TempSrc:   PainSc: 1    Pain Goal: Patients Stated Pain Goal: 3 (11/18/16 1637)               Madison HickmanGREGORY,Inaara Tye

## 2016-11-20 NOTE — Progress Notes (Signed)
Patient ID: Andrea Boyer, female   DOB: January 20, 1981, 36 y.o.   MRN: [id number] Pt doing well. Muscle cramps and HA resolved, pain controlled, lochia mild. She denies any fever, blurry vision or nausea. Bonding well with baby- breastfeeding. Baby with low normal BS VS 115-124/59-73 ABD- FF, soft EXT - no Homans  12.4>9.9<189  A/P: PPD#1 s/p svd - stable         Will d/c labetalol and flexeril         Routine pp care         Likely discharge to home tomorrow         Circ tomorrow per peds

## 2016-11-21 LAB — RH IG WORKUP (INCLUDES ABO/RH)
ABO/RH(D): A NEG
FETAL SCREEN: NEGATIVE
Gestational Age(Wks): 36.4
UNIT DIVISION: 0

## 2016-11-21 MED ORDER — IBUPROFEN 600 MG PO TABS: 600.0000 mg | ORAL_TABLET | Freq: Four times a day (QID) | ORAL | 0 refills | Status: DC

## 2016-11-21 NOTE — Lactation Note (Signed)
This note was copied from a baby's chart. Lactation Consultation Note experienced BF mom mom states BF going well. Baby cluster fed during the night. Nipples intact. Wears shells between feedings. Has small nipples, compressible breast. Mom has used DEBP a few times for stimulation. Has pump at home.  Has LC out Pt. Number if needs assistance for appt.  Encouraged mom to cont. To write I&O and take to Dr.  Discussed engorgement and management.  Patient Name: Andrea CharyBoy Andrea Boyer ZOXWR'UToday's Date: 11/21/2016 Reason for consult: Follow-up assessment   Maternal Data    Feeding Feeding Type: Breast Fed Nipple Type: Slow - flow Length of feed: 25 min  LATCH Score Latch: Grasps breast easily, tongue down, lips flanged, rhythmical sucking.  Audible Swallowing: Spontaneous and intermittent  Type of Nipple: Everted at rest and after stimulation  Comfort (Breast/Nipple): Soft / non-tender  Hold (Positioning): No assistance needed to correctly position infant at breast.  LATCH Score: 10  Interventions Interventions: Breast feeding basics reviewed;Support pillows;Assisted with latch;Position options;Skin to skin;Breast massage;Hand express;Shells  Lactation Tools Discussed/Used Tools: Shells;Pump;Flanges   Consult Status Consult Status: Complete Date: 11/21/16    Andrea DancerCARVER, Andrea Boyer 11/21/2016, 6:26 AM

## 2016-11-21 NOTE — Progress Notes (Signed)
Post Partum Day 2 Subjective: no complaints.  Pt states has felt tremendously better since delivery with no severe HA or other c/o.    Objective: Blood pressure 138/82, pulse 94, temperature 97.9 F (36.6 C), temperature source Oral, resp. rate 18, height 5\' 4"  (1.626 m), weight 84.8 kg (187 lb), unknown if currently breastfeeding.  Physical Exam:  General: alert and cooperative Lochia: appropriate Uterine Fundus: firm    Recent Labs  11/19/16 0750 11/20/16 0552  HGB 11.8* 9.9*  HCT 35.6* 30.1*    Assessment/Plan: Discharge home  Pt to keep BP log at home and call in later this week   LOS: 3 days   Oliver PilaKathy W Sherese Heyward 11/21/2016, 9:54 AM

## 2016-11-21 NOTE — Discharge Summary (Signed)
OB Discharge Summary     Patient Name: Andrea OchoaBrooke Devall DOB: [birth year] MRN: [id number]  Date of admission: 11/18/2016 Delivering MD: Andrea [facility]choaBANGA, [facility]   Date of discharge: 11/21/2016  Admitting diagnosis: 36wks sent by physician Intrauterine pregnancy: 430w4d     Secondary diagnosis:  Active Problems:   Gestational hypertension   SVD (spontaneous vaginal delivery)   Postpartum care following vaginal delivery   Preeclampsia  Additional problems: none     Discharge diagnosis: Preterm Pregnancy Delivered                                                                                                Post partum procedures:none  Augmentation: AROM and Pitocin  Complications: None  Hospital course:  Induction of Labor With Vaginal Delivery   36 y.o. yo G2P1101 at 3430w4d was admitted to the hospital 11/18/2016 for induction of labor for known preeclampsia with severe HA on day of admission and elevated blood pressures.  Indication for induction: Preeclampsia.  Patient had an uncomplicated labor course as follows: Membrane Rupture Time/Date: 12:19 PM ,11/19/2016   Intrapartum Procedures: Episiotomy: None [1]                                         Lacerations:  None [1]  Patient had delivery of a Viable infant.  Information for the patient's newborn:  Raynelle CharyMustian, Boy Ellise [[id number]][[id number]]  Delivery Method: Vaginal, Spontaneous Delivery (Filed from Delivery Summary)   11/19/2016  Details of delivery can be found in separate delivery note.  Patient had a routine postpartum course. Patient is discharged home 11/21/16.  Her blood pressures were completely normal off medications and her severe HA resolved.   Physical exam  Vitals:   11/20/16 0950 11/20/16 1000 11/20/16 1944 11/21/16 0624  BP: (!) 115/59 (!) 115/59 (!) 144/79 138/82  Pulse: 92 92 79 94  Resp: 18  18 18   Temp: 98.3 F (36.8 C)  98.4 F (36.9 C) 97.9 F (36.6 C)  TempSrc: Oral  Oral Oral  Weight:      Height:        General: alert and cooperative Lochia: appropriate Uterine Fundus: firm  Labs: Lab Results  Component Value Date   WBC 12.4 (H) 11/20/2016   HGB 9.9 (L) 11/20/2016   HCT 30.1 (L) 11/20/2016   MCV 92.6 11/20/2016   PLT 189 11/20/2016   CMP Latest Ref Rng & Units 10/20/2016  Glucose 65 - 99 mg/dL 80  BUN 6 - 20 mg/dL 8  Creatinine 0.860.44 - 5.781.00 mg/dL 4.690.46  Sodium 629135 - 528145 mmol/L 136  Potassium 3.5 - 5.1 mmol/L 3.9  Chloride 101 - 111 mmol/L 101  CO2 22 - 32 mmol/L 26  Calcium 8.9 - 10.3 mg/dL 41.310.2  Total Protein 6.5 - 8.1 g/dL 7.2  Total Bilirubin 0.3 - 1.2 mg/dL 0.5  Alkaline Phos 38 - 126 U/L 54  AST 15 - 41 U/L 16  ALT 14 - 54 U/L 14    Discharge instruction: per  After Visit Summary and "Baby and Me Booklet".  After visit meds:  Allergies as of 11/21/2016   No Known Allergies     Medication List    STOP taking these medications   butalbital-acetaminophen-caffeine 50-325-40 MG tablet Commonly known as:  FIORICET, ESGIC   ondansetron 4 MG disintegrating tablet Commonly known as:  ZOFRAN-ODT   ranitidine 150 MG tablet Commonly known as:  ZANTAC     TAKE these medications   acetaminophen 500 MG tablet Commonly known as:  TYLENOL Take 500 mg by mouth every 6 (six) hours as needed for mild pain or headache.   albuterol (2.5 MG/3ML) 0.083% nebulizer solution Commonly known as:  PROVENTIL Take 3 mLs (2.5 mg total) by nebulization every 6 (six) hours as needed for wheezing or shortness of breath. What changed:  Another medication with the same name was changed. Make sure you understand how and when to take each.   Albuterol Sulfate 108 (90 Base) MCG/ACT Aepb Commonly known as:  PROAIR RESPICLICK Inhale 2 puffs into the lungs every 4 (four) hours as needed. What changed:  reasons to take this   budesonide-formoterol 160-4.5 MCG/ACT inhaler Commonly known as:  SYMBICORT Inhale 2 puffs into the lungs 2 (two) times daily. What changed:  when to take  this  reasons to take this   ibuprofen 600 MG tablet Commonly known as:  ADVIL,MOTRIN Take 1 tablet (600 mg total) by mouth every 6 (six) hours.   ipratropium-albuterol 0.5-2.5 (3) MG/3ML Soln Commonly known as:  DUONEB Take 3 mLs by nebulization every 4 (four) hours as needed.   prenatal multivitamin Tabs tablet Take 1 tablet by mouth at bedtime.            Discharge Care Instructions        Start     Ordered   11/21/16 0000  ibuprofen (ADVIL,MOTRIN) 600 MG tablet  Every 6 hours     11/21/16 0956   11/21/16 0000  Diet - low sodium heart healthy     11/21/16 0956   11/21/16 0000  Discharge instructions    Comments:  Nothing in vagina for 6 weeks.  No sex, tampons, and douching.  Other instructions as in [employer].   11/21/16 0956      Diet: routine diet  Activity: Advance as tolerated. Pelvic rest for 6 weeks.   Outpatient follow up:  Pt will keep BP log and call in with values in 5 days.  She will schedule her routine posstpartum appt at 4-5 weeks. Follow up Appt:No future appointments. Follow up Visit:No Follow-up on file.  Postpartum contraception: IUD Mirena  Newborn Data: Live born female  Birth Weight: 5 lb 13.8 oz (2659 g) APGAR: 8, 9  Baby Feeding: Bottle and Breast Disposition:home with mother   11/21/2016 Oliver Pila, MD

## 2016-11-22 LAB — BPAM RBC
BLOOD PRODUCT EXPIRATION DATE: 201809182359
BLOOD PRODUCT EXPIRATION DATE: 201809252359
UNIT TYPE AND RH: 600
Unit Type and Rh: 600

## 2016-11-22 LAB — TYPE AND SCREEN
ABO/RH(D): A NEG
Antibody Screen: POSITIVE
DAT, IgG: NEGATIVE
UNIT DIVISION: 0
Unit division: 0

## 2016-11-24 DIAGNOSIS — Z0189 Encounter for other specified special examinations: Secondary | ICD-10-CM | POA: Diagnosis not present

## 2016-11-24 DIAGNOSIS — Z762 Encounter for health supervision and care of other healthy infant and child: Secondary | ICD-10-CM | POA: Diagnosis not present

## 2016-11-25 ENCOUNTER — Inpatient Hospital Stay (HOSPITAL_COMMUNITY): Admission: RE | Admit: 2016-11-25 | Payer: BLUE CROSS/BLUE SHIELD | Source: Ambulatory Visit

## 2016-11-30 DIAGNOSIS — O1403 Mild to moderate pre-eclampsia, third trimester: Secondary | ICD-10-CM | POA: Diagnosis not present

## 2016-11-30 DIAGNOSIS — O139 Gestational [pregnancy-induced] hypertension without significant proteinuria, unspecified trimester: Secondary | ICD-10-CM | POA: Diagnosis not present

## 2016-12-05 ENCOUNTER — Encounter: Payer: Self-pay | Admitting: Medical

## 2016-12-05 ENCOUNTER — Telehealth: Payer: Self-pay | Admitting: Medical

## 2016-12-05 ENCOUNTER — Ambulatory Visit (INDEPENDENT_AMBULATORY_CARE_PROVIDER_SITE_OTHER): Payer: BLUE CROSS/BLUE SHIELD | Admitting: Medical

## 2016-12-05 VITALS — BP 124/80 | HR 71 | Temp 98.4°F | Wt 177.2 lb

## 2016-12-05 DIAGNOSIS — J4542 Moderate persistent asthma with status asthmaticus: Secondary | ICD-10-CM

## 2016-12-05 DIAGNOSIS — J029 Acute pharyngitis, unspecified: Secondary | ICD-10-CM

## 2016-12-05 DIAGNOSIS — R05 Cough: Secondary | ICD-10-CM

## 2016-12-05 DIAGNOSIS — Z20818 Contact with and (suspected) exposure to other bacterial communicable diseases: Secondary | ICD-10-CM

## 2016-12-05 DIAGNOSIS — R059 Cough, unspecified: Secondary | ICD-10-CM

## 2016-12-05 LAB — POCT RAPID STREP A (OFFICE): RAPID STREP A SCREEN: NEGATIVE

## 2016-12-05 MED ORDER — AMOXICILLIN 500 MG PO CAPS: 500.0000 mg | ORAL_CAPSULE | Freq: Three times a day (TID) | ORAL | 0 refills | Status: DC

## 2016-12-05 MED ORDER — PREDNISONE 10 MG PO TABS: ORAL_TABLET | ORAL | 0 refills | Status: DC

## 2016-12-05 NOTE — Progress Notes (Signed)
Subjective: Chief Complaint  Patient presents with  . Sore Throat    sore throat , no fever , coughing    Here for illness x 6 days, sore throat, fever, cough.  Just gave birth 2 weeks ago.  Her 67 week old baby was fussy this past few days, coughing, vomiting after eating, and after taking him to pediatrician, he was found to have strep + throat swab.   Arma's 34yo son had 104 fever around the time of delivery and was staying at other family's house.   Since he has been back home he was diagnosed this week with croup, on steroids.   She also notes some family with a 9yo child had visited at the hospital and one of them complained of sore throat.  So she is assuming that between the collection of family and friends around in the past 2 weeks, someone likely had strep.  She hasn't had a severe sore throat but does have a sore throat.   No other aggravating or relieving factors. No other complaint.  Past Medical History:  Diagnosis Date  . Anxiety    history - no current problems  . Asthma    exercise induced rarely uses inhaler  . Elevated prolactin level (HCC) 10/2006   NEGATIVE MRI  . History of kidney stones    passes stones - no surgery required  . IBS (irritable bowel syndrome)   . PIH (pregnancy induced hypertension) 06/2011   PIH resolved after delivery   . Pneumonia 2010  . PONV (postoperative nausea and vomiting)   . Pregnancy induced hypertension   . SVD (spontaneous vaginal delivery)    x 1   Current Outpatient Prescriptions on File Prior to Visit  Medication Sig Dispense Refill  . acetaminophen (TYLENOL) 500 MG tablet Take 500 mg by mouth every 6 (six) hours as needed for mild pain or headache.    . albuterol (PROVENTIL) (2.5 MG/3ML) 0.083% nebulizer solution Take 3 mLs (2.5 mg total) by nebulization every 6 (six) hours as needed for wheezing or shortness of breath. 360 mL 1  . Albuterol Sulfate (PROAIR RESPICLICK) 108 (90 Base) MCG/ACT AEPB Inhale 2 puffs into the lungs  every 4 (four) hours as needed. (Patient taking differently: Inhale 2 puffs into the lungs every 4 (four) hours as needed (for shortness of breath/asthma). ) 1 each 2  . budesonide-formoterol (SYMBICORT) 160-4.5 MCG/ACT inhaler Inhale 2 puffs into the lungs 2 (two) times daily. (Patient taking differently: Inhale 2 puffs into the lungs 2 (two) times daily as needed (for shortness of breath/asthma). ) 1 Inhaler 11  . ibuprofen (ADVIL,MOTRIN) 600 MG tablet Take 1 tablet (600 mg total) by mouth every 6 (six) hours. 30 tablet 0  . ipratropium-albuterol (DUONEB) 0.5-2.5 (3) MG/3ML SOLN Take 3 mLs by nebulization every 4 (four) hours as needed. 360 mL 0  . Prenatal Vit-Fe Fumarate-FA (PRENATAL MULTIVITAMIN) TABS tablet Take 1 tablet by mouth at bedtime.     No current facility-administered medications on file prior to visit.    ROS as in subjective   Objective: BP 124/80   Pulse 71   Temp 98.4 F (36.9 C)   Wt 177 lb 3.2 oz (80.4 kg)   SpO2 97%   BMI 30.42 kg/m   General appearance: alert, no distress, WD/WN, mildly ill appearing HEENT: normocephalic, sclerae anicteric, conjunctiva pink and moist, TMs pearly, nares patent, clear discharge, +erythema, pharynx with mild erythema, tonsils unremarkable Oral cavity: MMM, no lesions Neck: supple, shoddy anterior  nodes, no thyromegaly, no masses Heart: RRR, normal S1, S2, no murmurs Lungs: CTA bilaterally, no wheezes, rhonchi, or rales Pulses: 2+ symmetric     Assessment: Encounter Diagnoses  Name Primary?  . Sore throat Yes  . Moderate persistent asthma with status asthmaticus   . Cough   . Streptococcus exposure   . Lactating mother     Plan: Given exposures to strep and croup and given her symptoms, we decided to be cautious and treat for strep and croupy cough.   Advised rest, hydration, begin medications below, use precautions and hand washing to prevent spread of infection.   Referral to pulmonology for asthma consult given  worsening asthma in last year.   F/u if worse or not improving.   Andrea Boyer was seen today for sore throat.  Diagnoses and all orders for this visit:  Sore throat -     Rapid Strep A -     Ambulatory referral to Pulmonology  Moderate persistent asthma with status asthmaticus -     Ambulatory referral to Pulmonology  Cough  Streptococcus exposure  Lactating mother  Other orders -     predniSONE (DELTASONE) 10 MG tablet; 6/5/4/3/2/1 -     amoxicillin (AMOXIL) 500 MG capsule; Take 1 capsule (500 mg total) by mouth 3 (three) times daily.

## 2016-12-05 NOTE — Telephone Encounter (Signed)
Refer to pulmonology or asthma clinic for asthma consult, worsening asthma

## 2016-12-06 ENCOUNTER — Other Ambulatory Visit: Payer: Self-pay

## 2016-12-06 NOTE — Telephone Encounter (Signed)
Sent referral 

## 2016-12-06 NOTE — Addendum Note (Signed)
Addended by: Winn Jock on: 12/06/2016 09:49 AM   Modules accepted: Orders

## 2016-12-09 ENCOUNTER — Encounter: Payer: Self-pay | Admitting: Pulmonary Disease

## 2016-12-09 ENCOUNTER — Ambulatory Visit (INDEPENDENT_AMBULATORY_CARE_PROVIDER_SITE_OTHER): Payer: BLUE CROSS/BLUE SHIELD | Admitting: Pulmonary Disease

## 2016-12-09 VITALS — BP 124/80 | HR 91 | Ht 64.0 in | Wt 178.0 lb

## 2016-12-09 DIAGNOSIS — J4542 Moderate persistent asthma with status asthmaticus: Secondary | ICD-10-CM

## 2016-12-09 DIAGNOSIS — J4540 Moderate persistent asthma, uncomplicated: Secondary | ICD-10-CM | POA: Diagnosis not present

## 2016-12-09 NOTE — Assessment & Plan Note (Signed)
Stay on Symbicort 160-2 puffs twice daily -this is a maintenance medication Use albuterol as needed for rescue  Probably worsened by the pregnancy state, will assess again in 2 months If she has frequent exacerbations or frequent use of albuterol then we'll consider allergy testing and stepup therapy, consider adding Singulair

## 2016-12-09 NOTE — Progress Notes (Signed)
Subjective:    Patient ID: Andrea Boyer, female    DOB: [birth year], 36 y.o.   MRN: [id number]  HPI  Chief Complaint  Patient presents with  . Pulm Consult    Referred for wheezing. Noticed during pregnancy.     36 year old never smoker presents for evaluation and management of persistent asthma. She just delivered her second baby 3 weeks ago. She is a 36-year-old who also has asthma. She reports asthma since her teenage years triggers being seasonal changes especially spring and fall, infections, cigarette smoke and exposure to wood heat. She has been maintained on Symbicort for many years with albuterol for rescue. She reports worsening asthma symptoms even before her current pregnancy. We will exacerbation especially during the third trimester and was placed on duo nebs, she eventually developed pregnancy-induced hypertension and underwent induction at 36 weeks and delivered a healthy baby.   About a week ago she developed sore throat, baby was also sick and she was given prednisone and amoxicillin by her PCP. She feels the wheezing is subsided and is back to normal . She denies nocturnal symptoms or excessive use of albuterol Spirometry today was a poor effort but showed normal ratio suggesting no evidence of airway obstruction.  Home environment-lives with her husband and 2 kids and a dog. 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 Anxiety    history - no current problems  . Asthma   . Elevated prolactin level (HCC) 10/2006   NEGATIVE MRI  . History of kidney stones    passes stones - no surgery required  . IBS (irritable bowel syndrome)   . PIH (pregnancy induced hypertension) 06/2011   PIH resolved after delivery   . Pneumonia 2010  . PONV (postoperative nausea and vomiting)   . Pregnancy induced  hypertension   . SVD (spontaneous vaginal delivery)    x 1   Past Surgical History:  Procedure Laterality Date  . FOOT FRACTURE SURGERY Right 2004   removed a bone from a fx foot  . LAPAROSCOPIC LYSIS OF ADHESIONS N/A 11/29/2012   Procedure: LAPAROSCOPIC LYSIS OF ADHESIONS;  Surgeon: Oliver Pila, MD;  Location: [facility];  Service: Gynecology;  Laterality: N/A;  . LAPAROSCOPY N/A 11/29/2012   Procedure: LAPAROSCOPY OPERATIVE;  Surgeon: Oliver Pila, MD;  Location: [facility];  Service: Gynecology;  Laterality: N/A;  . TONSILLECTOMY  1991  . WISDOM TOOTH EXTRACTION      No Known Allergies    Social History   Social History  . Marital status: Married    Spouse name: N/A  . Number of children: 1  . Years of education: N/A   Occupational History  . Activity Director [employer]   Social History Main Topics  . Smoking status: Never Smoker  . Smokeless tobacco: Never Used  . Alcohol use No  . Drug use: No  . Sexual activity: Yes    Partners: Male    Birth control/ protection: IUD     Comment: Mirena IUD 08/12/2011   Other Topics Concern  . 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 Hypertension  Mother   . Arthritis Mother   . Colon polyps Mother   . Stomach cancer Mother   . Diabetes Father   . Hypertension Father   . Colon polyps Father   . Diabetes Maternal Grandmother   . COPD Maternal Grandmother   . Hypertension Maternal Grandmother   . Ovarian cysts Maternal Grandmother   . Arthritis Maternal Grandmother   . Heart disease Maternal Grandmother   . Hypertension Maternal Grandfather   . Hypertension Paternal Grandmother   . Lung cancer Paternal Grandmother   . Hypertension Paternal Grandfather   . Stomach cancer Paternal Grandfather   . Muscular dystrophy Cousin   . Diabetes Paternal  Aunt   . Anesthesia problems Neg Hx   . Depression Neg Hx   . Asthma Neg Hx      Review of Systems Positive for shortness of breath and nonproductive cough, headaches, joint stiffness  Constitutional: negative for anorexia, fevers and sweats  Eyes: negative for irritation, redness and visual disturbance  Ears, nose, mouth, throat, and face: negative for earaches, epistaxis, nasal congestion and sore throat  Respiratory: negative for  sputum and wheezing  Cardiovascular: negative for chest pain,  lower extremity edema, orthopnea, palpitations and syncope  Gastrointestinal: negative for abdominal pain, constipation, diarrhea, melena, nausea and vomiting  Genitourinary:negative for dysuria, frequency and hematuria  Hematologic/lymphatic: negative for bleeding, easy bruising and lymphadenopathy  Musculoskeletal:negative for arthralgias, muscle weakness and stiff joints  Neurological: negative for coordination problems, gait problems, headaches and weakness  Endocrine: negative for diabetic symptoms including polydipsia, polyuria and weight loss      Objective:   Physical Exam  Gen. Pleasant, well-nourished, in no distress, normal affect ENT - no lesions, no post nasal drip Neck: No JVD, no thyromegaly, no carotid bruits Lungs: no use of accessory muscles, no dullness to percussion, clear without rales or rhonchi  Cardiovascular: Rhythm regular, heart sounds  normal, no murmurs or gallops, no peripheral edema Abdomen: soft and non-tender, no hepatosplenomegaly, BS normal. Musculoskeletal: No deformities, no cyanosis or clubbing Neuro:  alert, non focal       Assessment & Plan:

## 2016-12-09 NOTE — Patient Instructions (Signed)
Stay on Symbicort 160-2 puffs twice daily -this is a maintenance medication Use albuterol as needed for rescue  Call if worse and we will consider adding Singulair

## 2017-01-04 DIAGNOSIS — Z3009 Encounter for other general counseling and advice on contraception: Secondary | ICD-10-CM | POA: Diagnosis not present

## 2017-01-04 DIAGNOSIS — Z30430 Encounter for insertion of intrauterine contraceptive device: Secondary | ICD-10-CM | POA: Diagnosis not present

## 2017-01-04 DIAGNOSIS — Z1389 Encounter for screening for other disorder: Secondary | ICD-10-CM | POA: Diagnosis not present

## 2017-02-08 ENCOUNTER — Ambulatory Visit: Payer: Self-pay | Admitting: Adult Health

## 2017-02-17 DIAGNOSIS — Z30431 Encounter for routine checking of intrauterine contraceptive device: Secondary | ICD-10-CM | POA: Diagnosis not present

## 2017-06-05 DIAGNOSIS — M542 Cervicalgia: Secondary | ICD-10-CM | POA: Diagnosis not present

## 2017-06-05 DIAGNOSIS — M9901 Segmental and somatic dysfunction of cervical region: Secondary | ICD-10-CM | POA: Diagnosis not present

## 2017-06-05 DIAGNOSIS — R293 Abnormal posture: Secondary | ICD-10-CM | POA: Diagnosis not present

## 2017-06-05 DIAGNOSIS — M2560 Stiffness of unspecified joint, not elsewhere classified: Secondary | ICD-10-CM | POA: Diagnosis not present

## 2017-06-06 ENCOUNTER — Ambulatory Visit
Admission: RE | Admit: 2017-06-06 | Discharge: 2017-06-06 | Disposition: A | Discharge status: Home / Self Care | Payer: BLUE CROSS/BLUE SHIELD | Source: Ambulatory Visit | Attending: Family Medicine | Admitting: Family Medicine

## 2017-06-06 ENCOUNTER — Ambulatory Visit: Payer: BLUE CROSS/BLUE SHIELD | Admitting: Family Medicine

## 2017-06-06 ENCOUNTER — Encounter: Payer: Self-pay | Admitting: Family Medicine

## 2017-06-06 VITALS — BP 120/80 | HR 104 | Temp 98.7°F | Wt 173.0 lb

## 2017-06-06 DIAGNOSIS — M545 Low back pain, unspecified: Secondary | ICD-10-CM

## 2017-06-06 DIAGNOSIS — W19XXXA Unspecified fall, initial encounter: Secondary | ICD-10-CM

## 2017-06-06 DIAGNOSIS — S3992XA Unspecified injury of lower back, initial encounter: Secondary | ICD-10-CM | POA: Diagnosis not present

## 2017-06-06 LAB — POCT URINALYSIS DIP (PROADVANTAGE DEVICE)
Bilirubin, UA: NEGATIVE
GLUCOSE UA: NEGATIVE mg/dL
Ketones, POC UA: NEGATIVE mg/dL
Leukocytes, UA: NEGATIVE
Nitrite, UA: NEGATIVE
Protein Ur, POC: NEGATIVE mg/dL
RBC UA: NEGATIVE
SPECIFIC GRAVITY, URINE: 1.03
UUROB: NEGATIVE
pH, UA: 6 (ref 5.0–8.0)

## 2017-06-06 MED ORDER — HYDROCODONE-ACETAMINOPHEN 5-325 MG PO TABS: 0.5000 | ORAL_TABLET | Freq: Four times a day (QID) | ORAL | 0 refills | Status: AC | PRN

## 2017-06-06 NOTE — Patient Instructions (Signed)
Use heat 20 minutes at a time, 2-3 times per day.  Take ibuprofen 800 mg every 8 hours with food.   You may take the Norco as needed but do not drive, drink alcohol, this medication is sedating. Take it with food.

## 2017-06-06 NOTE — Progress Notes (Signed)
Subjective:    Patient ID: Andrea Boyer, female    DOB: 04-16-1980, 37 y.o.   MRN: [id number]  HPI Chief Complaint  Patient presents with  . fell    fell down stairs at home. pain in pelvic area and tailbone   She is here with complaints low back and tailbone pain after falling down her stairs at home 3 days ago. States this is a new house for her and she was wearing socks on her hardwood steps. States she slipped falling on her buttocks down the flight of stairs.  Does not think she hit her head or lost consciousness but may have.   Denies vision changes, dizziness, headaches, tinnitus, confusion, irritability, nausea, vomiting.  Denies numbness, tingling, weakness.  No loss of control of bowels or bladder.  No saddle anesthesia. She denies neck pain.  Pain is dull but worsens with activity such as walking or going from sitting to standing or even turning over in her bed.  States pain is relieved when lying on her side with a pillow between her knees.  Pain is nonradiating. States she has tried using ice and taking ibuprofen 800 mg every 8 hours.   States she had a vaginal birth 6 months ago.  Is not breastfeeding.   Reviewed allergies, medications, past medical, surgical, family, and social history.  Review of Systems Pertinent positives and negatives in the history of present illness.     Objective:   Physical Exam  Constitutional: She is oriented to person, place, and time. She appears well-developed and well-nourished. No distress.  HENT:  Head: Normocephalic and atraumatic.  Right Ear: Tympanic membrane and ear canal normal.  Left Ear: Tympanic membrane and ear canal normal.  Nose: Nose normal.  Mouth/Throat: Uvula is midline, oropharynx is clear and moist and mucous membranes are normal.  Eyes: Conjunctivae and EOM are normal. Pupils are equal, round, and reactive to light.  Neck: Full passive range of motion without pain. Neck supple. No spinous process tenderness and  no muscular tenderness present.  Cardiovascular: Normal rate, regular rhythm, normal heart sounds and intact distal pulses.  Pulmonary/Chest: Effort normal and breath sounds normal.  Musculoskeletal:       Cervical back: Normal.       Thoracic back: Normal.       Lumbar back: She exhibits decreased range of motion, tenderness and pain. She exhibits no spasm.       Back:  Lumbar spine and paraspinal muscle tenderness. No obvious step off. Pain with ROM. Negative straight leg raise.  Bilateral LE neurovascularly intact.   Neurological: She is alert and oriented to person, place, and time. She has normal strength and normal reflexes. No cranial nerve deficit or sensory deficit. Gait abnormal.  Skin: Skin is warm and dry. No bruising noted. No pallor.  Psychiatric: She has a normal mood and affect. Her speech is normal and behavior is normal. Thought content normal.   BP 120/80   Pulse (!) 104   Temp 98.7 F (37.1 C) (Oral)   Wt 173 lb (78.5 kg)   Breastfeeding? No   BMI 29.70 kg/m         Assessment & Plan:  Acute midline low back pain without sciatica - Plan: DG Lumbar Spine Complete, POCT Urinalysis DIP (Proadvantage Device), HYDROcodone-acetaminophen (NORCO) 5-325 MG tablet  Fall, initial encounter - Plan: DG Lumbar Spine Complete, HYDROcodone-acetaminophen (NORCO) 5-325 MG tablet  Discussed that she does not have any neurological deficits and no obvious concussion  symptoms.  Pain is not improved with ibuprofen and ice. We will send her for lumbar x-ray.  Have her continue on 800 mg ibuprofen 3 times per day with food and I will prescribe hydrocodone for her to take for severe pain.  Advised of potential side effects and to avoid alcohol or driving with this medication.  Recommend she switch to heat at this point.  She is not breast-feeding. She will follow-up next week pending x-ray.

## 2017-06-13 DIAGNOSIS — R293 Abnormal posture: Secondary | ICD-10-CM | POA: Diagnosis not present

## 2017-06-13 DIAGNOSIS — M9901 Segmental and somatic dysfunction of cervical region: Secondary | ICD-10-CM | POA: Diagnosis not present

## 2017-06-13 DIAGNOSIS — M542 Cervicalgia: Secondary | ICD-10-CM | POA: Diagnosis not present

## 2017-06-13 DIAGNOSIS — M2560 Stiffness of unspecified joint, not elsewhere classified: Secondary | ICD-10-CM | POA: Diagnosis not present

## 2017-06-23 ENCOUNTER — Ambulatory Visit: Payer: BLUE CROSS/BLUE SHIELD | Admitting: Medical

## 2017-10-18 ENCOUNTER — Ambulatory Visit: Payer: Self-pay | Admitting: Family Medicine

## 2017-10-20 ENCOUNTER — Encounter: Payer: Self-pay | Admitting: Family Medicine

## 2017-10-20 ENCOUNTER — Ambulatory Visit: Payer: BLUE CROSS/BLUE SHIELD | Admitting: Family Medicine

## 2017-10-20 VITALS — BP 120/76 | HR 88 | Temp 98.4°F | Resp 16 | Ht 65.0 in | Wt 168.4 lb

## 2017-10-20 DIAGNOSIS — J0140 Acute pansinusitis, unspecified: Secondary | ICD-10-CM | POA: Diagnosis not present

## 2017-10-20 DIAGNOSIS — F419 Anxiety disorder, unspecified: Secondary | ICD-10-CM | POA: Diagnosis not present

## 2017-10-20 DIAGNOSIS — J4540 Moderate persistent asthma, uncomplicated: Secondary | ICD-10-CM

## 2017-10-20 MED ORDER — ALBUTEROL SULFATE 108 (90 BASE) MCG/ACT IN AEPB: 2.0000 | INHALATION_SPRAY | RESPIRATORY_TRACT | 0 refills | Status: DC | PRN

## 2017-10-20 MED ORDER — AMOXICILLIN 875 MG PO TABS: 875.0000 mg | ORAL_TABLET | Freq: Two times a day (BID) | ORAL | 0 refills | Status: DC

## 2017-10-20 MED ORDER — BUDESONIDE-FORMOTEROL FUMARATE 160-4.5 MCG/ACT IN AERO
2.0000 | INHALATION_SPRAY | Freq: Two times a day (BID) | RESPIRATORY_TRACT | 0 refills | Status: DC
Start: 2017-10-20 — End: 2018-06-28

## 2017-10-20 MED ORDER — ALPRAZOLAM 0.25 MG PO TABS: 0.2500 mg | ORAL_TABLET | Freq: Two times a day (BID) | ORAL | 0 refills | Status: DC | PRN

## 2017-10-20 NOTE — Patient Instructions (Signed)
You appear to have acute sinusitis.  Take the antibiotic as prescribed.  Stay well-hydrated.  You may also continue taking Advil Cold and Sinus if this helps.  I recommend salt water gargles and throat lozenges as needed.  Continue on your allergy and asthma medication  I am prescribing alprazolam for anxiety.  Do not drink alcohol with this medication.  This is meant for short-term use.  I recommend counseling and follow-up with your primary care provider.

## 2017-10-20 NOTE — Progress Notes (Signed)
Chief Complaint  Patient presents with  . sinus    st, congestion, pressure,cough X tuesday  needs inhalers refilled    Subjective:  Andrea Boyer is a 37 y.o. female who presents for a 4-5 day history of sore throat, rhinorrhea, nasal congestion, sinus pressure, frontal headache, post nasal drainage. Reports chills and body aches. Questionable fever but has not checked her temperature.  She has underlying asthma and is taking Symbicort most days. Using albuterol more often this week. Reports wheezing and some shortness of breath.  Takes Zyrtec daily.   States she has been feeling anxious lately and needs medication for this. She has been taking care of her father who recently had a stroke and now has memory issues and unable to care for himself.  She has taken Xanax in the past and requests this.   Denies chest pain, palpitations, abdominal pain, N/V/D.   Treatment to date: antihistamines, cough suppressants and decongestants.  Positve sick contacts.  No other aggravating or relieving factors.  No other c/o.  ROS as in subjective.   Objective: Vitals:   10/20/17 1138  BP: 120/76  Pulse: 88  Resp: 16  Temp: 98.4 F (36.9 C)  SpO2: 98%    General appearance: Alert, WD/WN, no distress, mildly ill appearing                             Skin: warm, no rash                           Head: + frontal and maxillary sinus tenderness                            Eyes: conjunctiva normal, corneas clear, PERRLA                            Ears: scarring to bilateral TMs otherwise normal, external ear canals normal                          Nose: septum midline, turbinates swollen, with erythema and thick discharge             Mouth/throat: MMM, tongue normal, mild pharyngeal erythema, no exudate                           Neck: supple, no adenopathy, no thyromegaly, nontender                          Heart: RRR, normal S1, S2, no murmurs                         Lungs: CTA bilaterally, no  wheezes, rales, or rhonchi      Assessment: Acute non-recurrent pansinusitis - Plan: amoxicillin (AMOXIL) 875 MG tablet  Anxiety - Plan: ALPRAZolam (XANAX) 0.25 MG tablet  Moderate persistent asthma without complication    Plan: Discussed diagnosis and treatment of acute sinusitis.  Amoxicillin prescribed.  Discussed that she continue with allergy and asthma treatment.  No acute flare.  Refilled Symbicort and albuterol per patient request.  Discussed symptomatic treatment.  She will follow-up if not back to baseline after completing the antibiotic.  In regards to anxiety, discussed  alprazolam is short-term treatment and not intended for long-term use.  Advised her to avoid alcohol with this.  States she has done well in the past take this medication.  Recommend she follow-up with her primary care provider for further treatment for anxiety.  I strongly encouraged counseling.

## 2017-11-21 ENCOUNTER — Other Ambulatory Visit: Payer: Self-pay | Admitting: Medical

## 2017-11-21 MED ORDER — CITALOPRAM HYDROBROMIDE 20 MG PO TABS: 20.0000 mg | ORAL_TABLET | Freq: Every day | ORAL | 0 refills | Status: DC

## 2017-11-28 DIAGNOSIS — M542 Cervicalgia: Secondary | ICD-10-CM | POA: Diagnosis not present

## 2017-11-29 DIAGNOSIS — M542 Cervicalgia: Secondary | ICD-10-CM | POA: Diagnosis not present

## 2017-11-29 DIAGNOSIS — M5010 Cervical disc disorder with radiculopathy, unspecified cervical region: Secondary | ICD-10-CM | POA: Diagnosis not present

## 2017-12-30 DIAGNOSIS — M542 Cervicalgia: Secondary | ICD-10-CM | POA: Diagnosis not present

## 2018-01-02 DIAGNOSIS — M542 Cervicalgia: Secondary | ICD-10-CM | POA: Diagnosis not present

## 2018-01-11 DIAGNOSIS — M5412 Radiculopathy, cervical region: Secondary | ICD-10-CM | POA: Diagnosis not present

## 2018-01-11 DIAGNOSIS — M5010 Cervical disc disorder with radiculopathy, unspecified cervical region: Secondary | ICD-10-CM | POA: Diagnosis not present

## 2018-01-11 DIAGNOSIS — M542 Cervicalgia: Secondary | ICD-10-CM | POA: Diagnosis not present

## 2018-02-23 IMAGING — CT CT ABD-PELV W/O CM
2 of 4 series · 13 of 36 positions shown, 19 images · non-contrast
Comparison: None.

CLINICAL DATA: RIGHT flank pain for 1 month with nausea and
vomiting, history kidney stones, IUD, irritable bowel syndrome,
asthma

EXAM:
CT ABDOMEN AND PELVIS WITHOUT CONTRAST
TECHNIQUE: Multidetector CT imaging of the abdomen and pelvis was performed
following the standard protocol without IV contrast. Sagittal and
coronal MPR images reconstructed from axial data set. Oral contrast
not administered for this indication.

[Series 601: coronal body · coronal · 0.87mm/px · 1 of 115 slices shown, 2 images]
[im 39/115  soft-tissue]
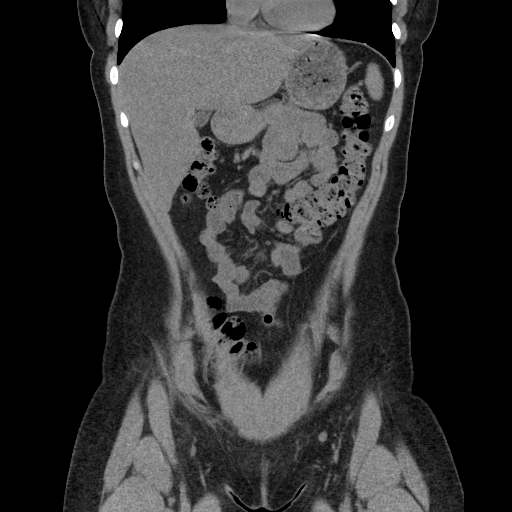
[im 39/115  bone]
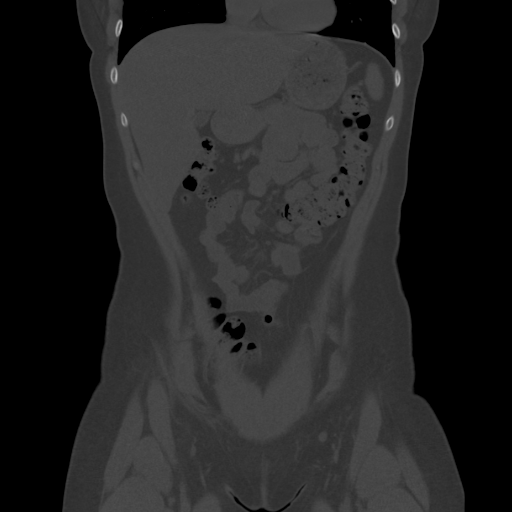

[Series 602: sagittal body · sagittal · 0.87mm/px · 12 of 151 slices shown, 17 images]
[im 9/151  soft-tissue]
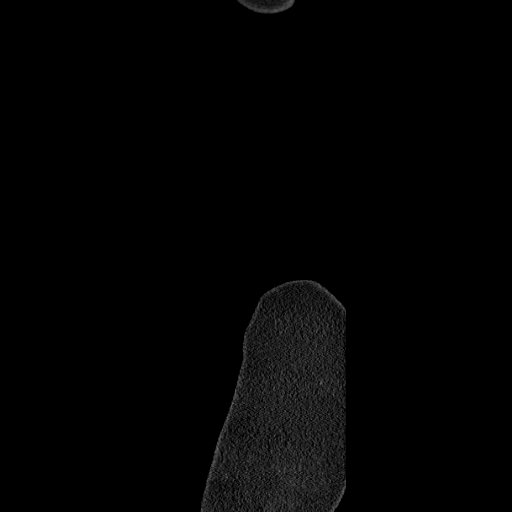
[im 9/151  lung]
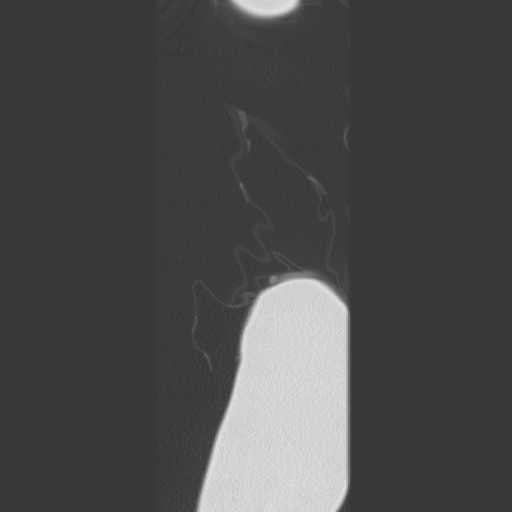
[im 9/151  bone]
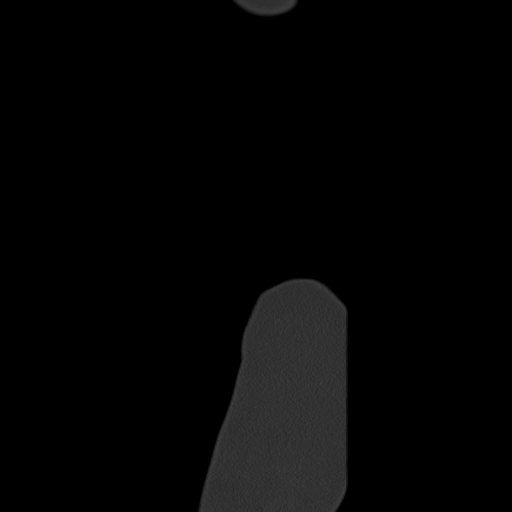
[im 17/151  lung]
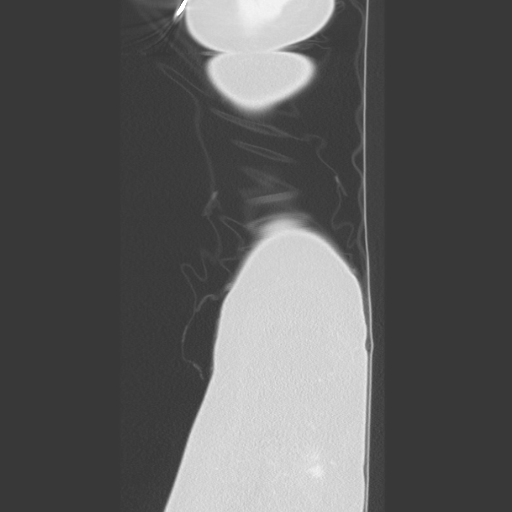
[im 26/151  soft-tissue]
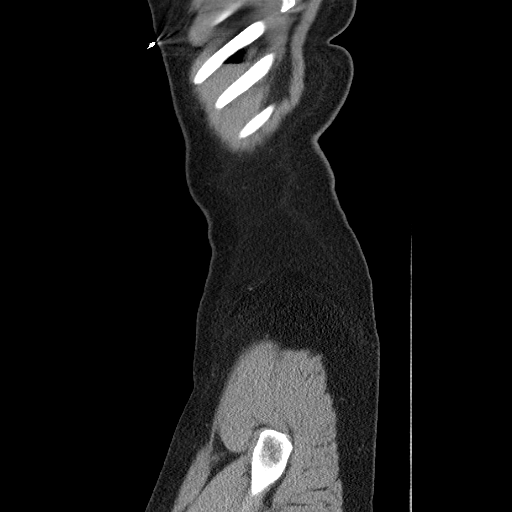
[im 26/151  lung]
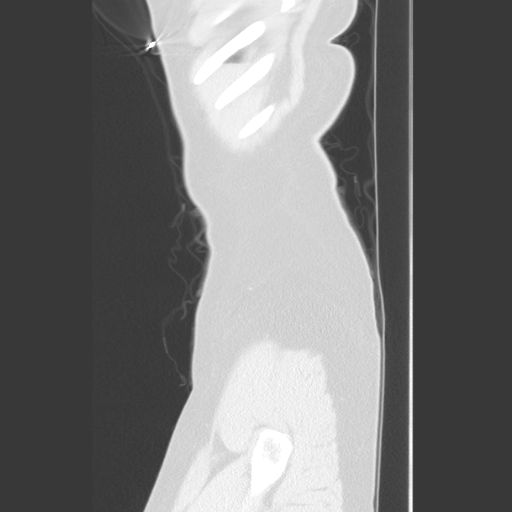
[im 34/151  soft-tissue]
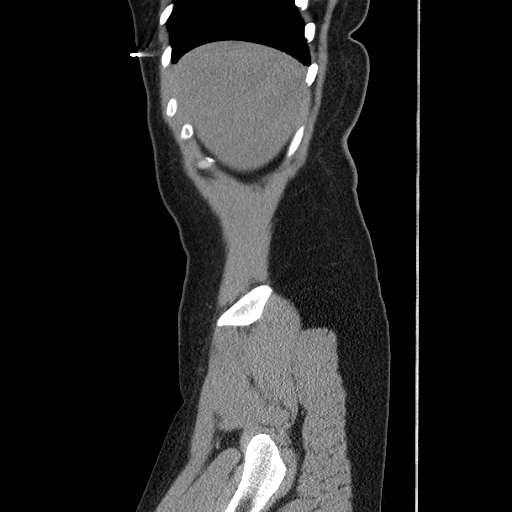
[im 34/151  lung]
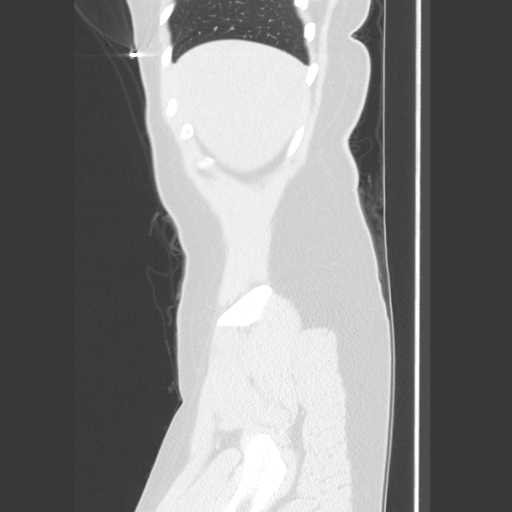
[im 51/151  soft-tissue]
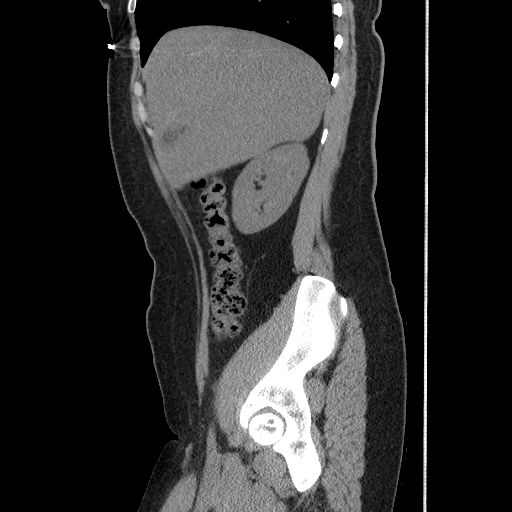
[im 59/151  soft-tissue]
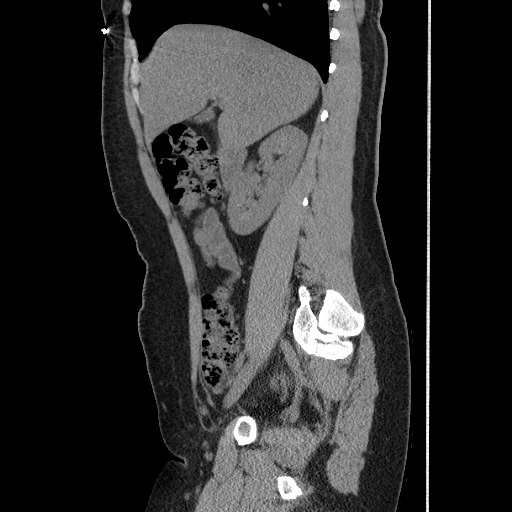
[im 76/151  soft-tissue]
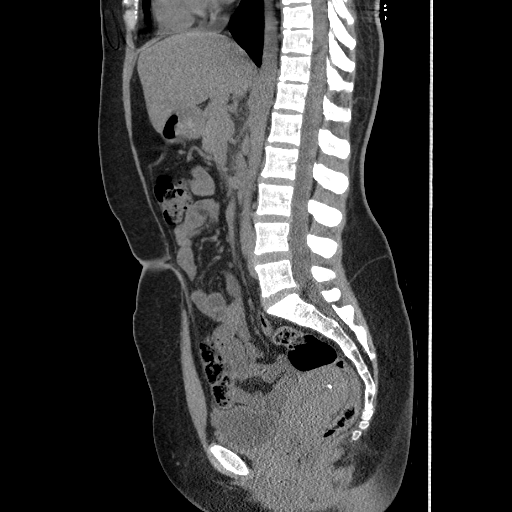
[im 92/151  soft-tissue]
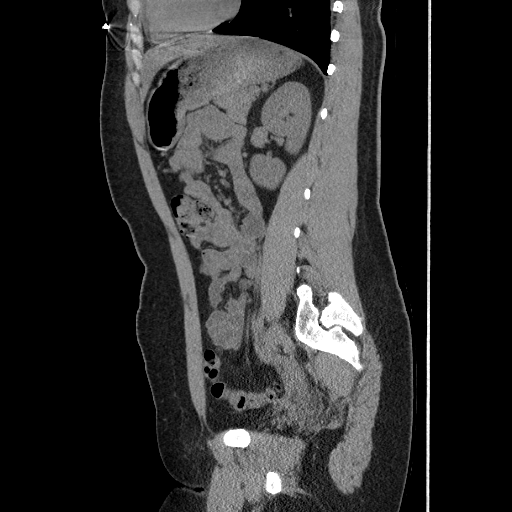
[im 101/151  soft-tissue]
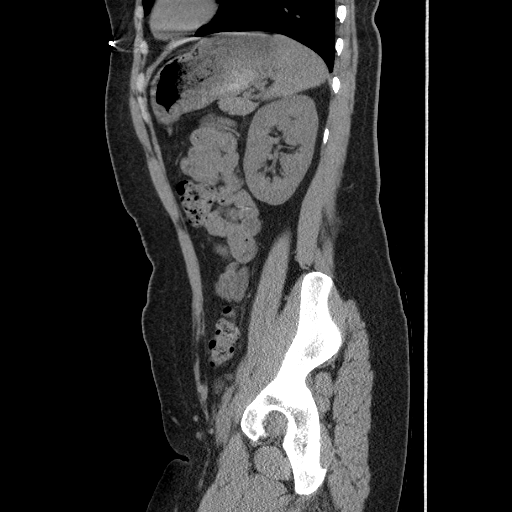
[im 117/151  soft-tissue]
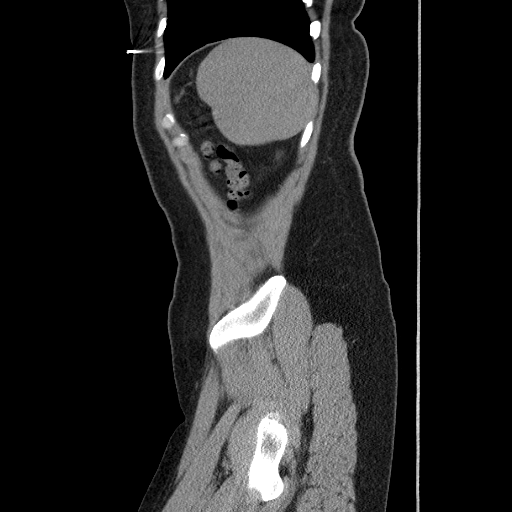
[im 117/151  bone]
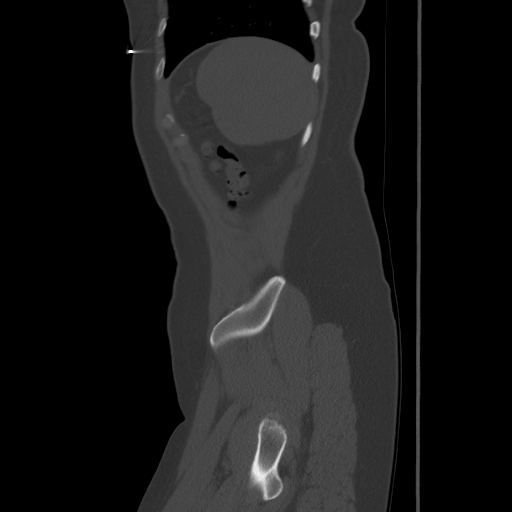
[im 126/151  soft-tissue]
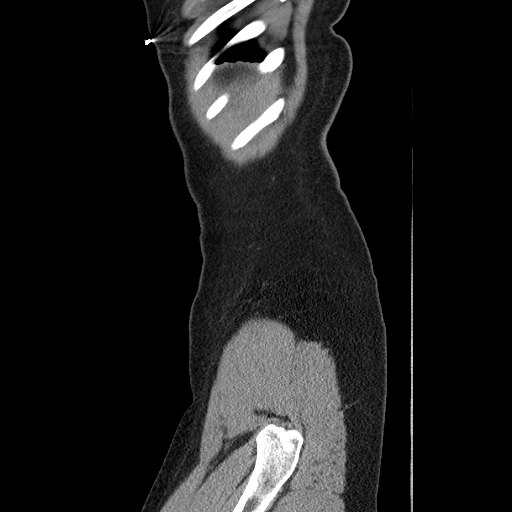
[im 142/151  soft-tissue]
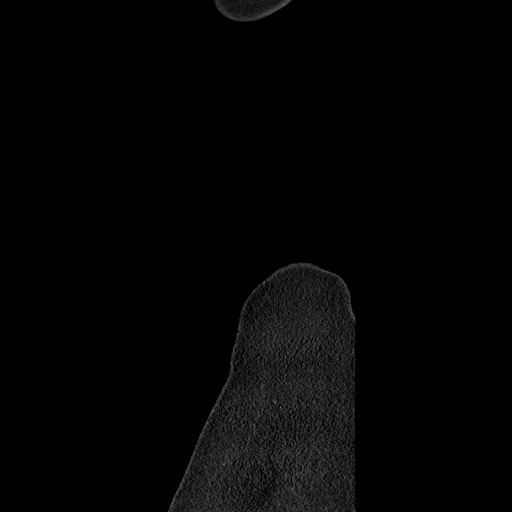

[13 of 36 positions shown; findings below may reference images not displayed]

FINDINGS: Lower chest:  Lung bases clear

Hepatobiliary: Liver normal appearance.  Contracted gallbladder.

Pancreas: Normal appearance

Spleen: Normal appearance

Adrenals/Urinary Tract: Adrenal glands normal appearance. Kidneys
normal appearance without mass or hydronephrosis. Normal appearing
ureters without urinary tract calcification. Bladder unremarkable.

Stomach/Bowel: Appendix not visualized. No pericecal inflammatory
process seen. Stomach and bowel loops unremarkable for technique.

Vascular/Lymphatic: No adenopathy. Vascular structures unremarkable.

Reproductive: IUD within uterus. Otherwise unremarkable uterus and
adnexa.

Other: No mass, free air, free fluid or inflammatory process. No
hernia.

Musculoskeletal: Normal appearance
IMPRESSION: Normal exam.

## 2018-06-28 ENCOUNTER — Other Ambulatory Visit: Payer: Self-pay | Admitting: Family Medicine

## 2018-06-28 MED ORDER — ALBUTEROL SULFATE 108 (90 BASE) MCG/ACT IN AEPB: 2.0000 | INHALATION_SPRAY | RESPIRATORY_TRACT | 0 refills | Status: DC | PRN

## 2018-06-28 MED ORDER — BUDESONIDE-FORMOTEROL FUMARATE 160-4.5 MCG/ACT IN AERO: 2.0000 | INHALATION_SPRAY | Freq: Two times a day (BID) | RESPIRATORY_TRACT | 0 refills | Status: DC

## 2018-06-28 NOTE — Telephone Encounter (Signed)
Are these okay to refill? 

## 2018-11-27 DIAGNOSIS — Z20828 Contact with and (suspected) exposure to other viral communicable diseases: Secondary | ICD-10-CM | POA: Diagnosis not present

## 2018-12-04 DIAGNOSIS — Z20828 Contact with and (suspected) exposure to other viral communicable diseases: Secondary | ICD-10-CM | POA: Diagnosis not present

## 2018-12-10 DIAGNOSIS — Z20828 Contact with and (suspected) exposure to other viral communicable diseases: Secondary | ICD-10-CM | POA: Diagnosis not present

## 2018-12-17 DIAGNOSIS — Z20828 Contact with and (suspected) exposure to other viral communicable diseases: Secondary | ICD-10-CM | POA: Diagnosis not present

## 2018-12-24 DIAGNOSIS — Z20828 Contact with and (suspected) exposure to other viral communicable diseases: Secondary | ICD-10-CM | POA: Diagnosis not present

## 2018-12-31 DIAGNOSIS — Z20828 Contact with and (suspected) exposure to other viral communicable diseases: Secondary | ICD-10-CM | POA: Diagnosis not present

## 2019-01-07 DIAGNOSIS — Z20828 Contact with and (suspected) exposure to other viral communicable diseases: Secondary | ICD-10-CM | POA: Diagnosis not present

## 2019-01-14 DIAGNOSIS — Z20828 Contact with and (suspected) exposure to other viral communicable diseases: Secondary | ICD-10-CM | POA: Diagnosis not present

## 2019-02-22 ENCOUNTER — Ambulatory Visit: Payer: BC Managed Care – PPO | Admitting: Medical

## 2019-02-22 ENCOUNTER — Other Ambulatory Visit: Payer: Self-pay

## 2019-04-10 ENCOUNTER — Telehealth: Payer: Self-pay | Admitting: Medical

## 2019-04-10 NOTE — Telephone Encounter (Signed)
Pt called and wanted to see if she could get a new prescription for a nebulizer that she had. She wasn't sure if you or vickie prescribed it but it has been awhile. Pt stated that she tested positive for Covid and has been having to use her inhaler more often. Pt advised that she may need a virtual visit. She uses the [facility] on [address]

## 2019-04-11 NOTE — Telephone Encounter (Signed)
See me Friday morning about script order to call this in for [employer]

## 2019-05-22 ENCOUNTER — Telehealth: Payer: Self-pay | Admitting: Medical

## 2019-05-22 ENCOUNTER — Encounter: Payer: Self-pay | Admitting: Medical

## 2019-05-22 ENCOUNTER — Ambulatory Visit (INDEPENDENT_AMBULATORY_CARE_PROVIDER_SITE_OTHER): Payer: BC Managed Care – PPO | Admitting: Medical

## 2019-05-22 ENCOUNTER — Other Ambulatory Visit: Payer: Self-pay

## 2019-05-22 VITALS — BP 120/82 | HR 77 | Temp 98.0°F | Ht 65.0 in | Wt 157.4 lb

## 2019-05-22 DIAGNOSIS — R3989 Other symptoms and signs involving the genitourinary system: Secondary | ICD-10-CM | POA: Diagnosis not present

## 2019-05-22 DIAGNOSIS — M5441 Lumbago with sciatica, right side: Secondary | ICD-10-CM

## 2019-05-22 DIAGNOSIS — R32 Unspecified urinary incontinence: Secondary | ICD-10-CM | POA: Diagnosis not present

## 2019-05-22 DIAGNOSIS — M5431 Sciatica, right side: Secondary | ICD-10-CM

## 2019-05-22 DIAGNOSIS — G8929 Other chronic pain: Secondary | ICD-10-CM | POA: Diagnosis not present

## 2019-05-22 DIAGNOSIS — G834 Cauda equina syndrome: Secondary | ICD-10-CM | POA: Insufficient documentation

## 2019-05-22 LAB — POCT URINALYSIS DIP (PROADVANTAGE DEVICE)
Bilirubin, UA: NEGATIVE
Blood, UA: NEGATIVE
Glucose, UA: NEGATIVE mg/dL
Ketones, POC UA: NEGATIVE mg/dL
Leukocytes, UA: NEGATIVE
Nitrite, UA: NEGATIVE
Protein Ur, POC: NEGATIVE mg/dL
Specific Gravity, Urine: 1.02
Urobilinogen, Ur: NEGATIVE
pH, UA: 6.5 (ref 5.0–8.0)

## 2019-05-22 LAB — POCT URINE PREGNANCY: Preg Test, Ur: NEGATIVE

## 2019-05-22 NOTE — Progress Notes (Signed)
Subjective:  Andrea Boyer is a 39 y.o. female who presents for Chief Complaint  Patient presents with  . Dysuria  . Sciatica     Here today for concern of spasm or bladder pressure, ongoing sciatica, new incontinence.  Has 4-6 weeks squeezing sensation in lower abdomen, bladder spasms.   Has had a lot of problems with sciatica for the past year or more.  Gets frequent pressure in low back on the right, pain down the right buttock and leg, tingling in the right leg.  This pain is persistent for months.  It makes it difficult to sit for very long.  In the evening when she gets home she cannot really sit in a chair or sit in any position for too long.  Lying flat seems to help better.  She also has history of bulging disc in neck.  She has seen [facility] in the past for this.  She continues to have pain in her right arm, tingling is in her right arm.  She continues to have neck pain.  She wonders if she has similar issue in the low back now.  She notes 2 separate issues in January and recently where she went to stand and had immediate urinary incontinence without any urge without coughing or laughing without feeling the need to go to the bathroom.  She denies blood in urine, no cloudy or odorous urine, no frequency of urination, no urgency.  Although she does not think she has urinary tract infection, she started drinking cranberry juice recently in the event she did have a UTI.   She notes no vaginal c/o, no bulging or tissue hanging out from vagina.   No concern for STD or pregnancy.  She has a Mirena IUD, she is married.     Her last Pap smear was probably 2 years ago at [facility] gynecology, reportedly normal  No other aggravating or relieving factors.    No other c/o.  The following portions of the patient's history were reviewed and updated as appropriate: allergies, current medications, past family history, past medical history, past social history, past surgical history and  problem list.  ROS Otherwise as in subjective above    Objective: BP 120/82   Pulse 77   Temp 98 F (36.7 C)   Ht 5\' 5"  (1.651 m)   Wt 157 lb 6.4 oz (71.4 kg)   SpO2 96%   BMI 26.19 kg/m   General appearance: alert, no distress, well developed, well nourished Abdomen: +bs, soft, non tender, non distended, no masses, no hepatomegaly, no splenomegaly Back with mild tenderness in the right lumbar spine paraspinal with positive spasm muscle, normal range of motion with flexion and extension of back, no deformity Legs nontender, normal range of motion, no swelling, tender in right sciatic notch Negative straight leg raise, strength and sensation seems normal of legs, DTRs bilateral legs 1+ Pulses: 2+ radial pulses, 2+ pedal pulses, normal cap refill Ext: no edema   Assessment: Encounter Diagnoses  Name Primary?  . Urinary incontinence, unspecified type Yes  . Sciatica of right side   . Chronic right-sided low back pain with right-sided sciatica   . Sensation of pressure in bladder area   . Cauda equina syndrome with neurogenic bladder (HCC)      Plan: We discussed her urine sympotms, back and sciatia sympotms.  Given no obvious urinary tract infection or posible bladder dysncution, need to rule out cauda equina or more urgent issue.   We will  pursue MRI Lumbar spoine.  Urinalysis and uprine prengndy reviewed today, unremarkable.  I advised if any new changes in the next few days such as saddle anesthesia, problems walking, severe pain, or symptoms in general to call right away to be seen urgently for recheck  Andrea Boyer was seen today for dysuria and sciatica.  Diagnoses and all orders for this visit:  Urinary incontinence, unspecified type -     Basic metabolic panel -     POCT Urinalysis DIP (Proadvantage Device) -     MR Lumbar Spine Wo Contrast; Future -     POCT urine pregnancy -     Cancel: POCT Urinalysis DIP (Proadvantage Device)  Sciatica of right side -     MR  Lumbar Spine Wo Contrast; Future  Chronic right-sided low back pain with right-sided sciatica -     Basic metabolic panel -     MR Lumbar Spine Wo Contrast; Future -     POCT urine pregnancy  Sensation of pressure in bladder area -     MR Lumbar Spine Wo Contrast; Future  Cauda equina syndrome with neurogenic bladder (HCC) -     MR Lumbar Spine Wo Contrast; Future    Follow up: pending MRI

## 2019-05-22 NOTE — Telephone Encounter (Signed)
Please set up STAT MRI lumbar spine  She has possible cauda equina syndrome.  She has chronic low back pain and sciatica symptoms on the right for over a year, worsening sciatica, but new unexplained bladder incontinence intermittent

## 2019-05-22 NOTE — Telephone Encounter (Signed)
Authorization approved stat

## 2019-05-23 LAB — BASIC METABOLIC PANEL
BUN/Creatinine Ratio: 19 (ref 9–23)
BUN: 13 mg/dL (ref 6–20)
CO2: 22 mmol/L (ref 20–29)
Calcium: 9.9 mg/dL (ref 8.7–10.2)
Chloride: 106 mmol/L (ref 96–106)
Creatinine, Ser: 0.67 mg/dL (ref 0.57–1.00)
GFR calc Af Amer: 131 mL/min/{1.73_m2} (ref >59)
GFR calc non Af Amer: 113 mL/min/{1.73_m2} (ref >59)
Glucose: 78 mg/dL (ref 65–99)
Potassium: 4.5 mmol/L (ref 3.5–5.2)
Sodium: 143 mmol/L (ref 134–144)

## 2019-05-31 DIAGNOSIS — Z1389 Encounter for screening for other disorder: Secondary | ICD-10-CM | POA: Diagnosis not present

## 2019-05-31 DIAGNOSIS — Z3A26 26 weeks gestation of pregnancy: Secondary | ICD-10-CM | POA: Diagnosis not present

## 2019-05-31 DIAGNOSIS — Z01419 Encounter for gynecological examination (general) (routine) without abnormal findings: Secondary | ICD-10-CM | POA: Diagnosis not present

## 2019-05-31 DIAGNOSIS — Z130 Encounter for screening for diseases of the blood and blood-forming organs and certain disorders involving the immune mechanism: Secondary | ICD-10-CM | POA: Diagnosis not present

## 2019-06-20 ENCOUNTER — Other Ambulatory Visit: Payer: Self-pay

## 2019-06-20 ENCOUNTER — Ambulatory Visit
Admission: RE | Admit: 2019-06-20 | Discharge: 2019-06-20 | Disposition: A | Discharge status: Home / Self Care | Payer: BC Managed Care – PPO | Source: Ambulatory Visit | Attending: Medical | Admitting: Medical

## 2019-06-20 DIAGNOSIS — R3989 Other symptoms and signs involving the genitourinary system: Secondary | ICD-10-CM

## 2019-06-20 DIAGNOSIS — G8929 Other chronic pain: Secondary | ICD-10-CM

## 2019-06-20 DIAGNOSIS — R32 Unspecified urinary incontinence: Secondary | ICD-10-CM

## 2019-06-20 DIAGNOSIS — M48061 Spinal stenosis, lumbar region without neurogenic claudication: Secondary | ICD-10-CM | POA: Diagnosis not present

## 2019-06-20 DIAGNOSIS — G834 Cauda equina syndrome: Secondary | ICD-10-CM

## 2019-06-20 DIAGNOSIS — M5431 Sciatica, right side: Secondary | ICD-10-CM

## 2019-06-25 ENCOUNTER — Telehealth: Payer: Self-pay | Admitting: Medical

## 2019-06-25 NOTE — Telephone Encounter (Signed)
Sent via message via [employer]

## 2019-06-25 NOTE — Telephone Encounter (Signed)
If she can get in with [facility], then have her make that follow up ASAP.   MRI didn't show anything major that would cause her level of pain

## 2019-07-08 ENCOUNTER — Telehealth: Payer: Self-pay | Admitting: Medical

## 2019-07-08 ENCOUNTER — Other Ambulatory Visit: Payer: Self-pay | Admitting: Medical

## 2019-07-08 MED ORDER — HYDROCODONE-ACETAMINOPHEN 5-325 MG PO TABS: 1.0000 | ORAL_TABLET | Freq: Four times a day (QID) | ORAL | 0 refills | Status: DC | PRN

## 2019-07-08 NOTE — Telephone Encounter (Signed)
Left detail message on machine inquiring about which referral patient wants and asked her to call the office or send a message on mychart letting us know.

## 2019-07-08 NOTE — Telephone Encounter (Signed)
I am confused.  Her email messages to me was that [facility] does NOT see people for back pain.  Here are options: Option 1-continue plan to see [facility]  Option 2-referral to physical therapy for back pain and radicular pain  Option 3-referral to different orthopedist for back pain and radicular back pain

## 2019-07-08 NOTE — Telephone Encounter (Signed)
Pt stated she has an appointment with [facility] tomorrow. She would like to see them about it.

## 2019-07-08 NOTE — Telephone Encounter (Signed)
Patient stated she was told by someone at [facility] office that they see people for back pain. Please advise.

## 2019-07-08 NOTE — Telephone Encounter (Signed)
Please call her to see about referral to physical therapy.  See if she has a preference on which office she would like to go to in terms of their location  This is in regards to back pain and radicular pain down the extremity  If she refuses then refer to an orthopedist different the [facility]

## 2019-07-09 NOTE — Telephone Encounter (Signed)
Sent patient a message via my chart.  

## 2019-07-11 DIAGNOSIS — M5412 Radiculopathy, cervical region: Secondary | ICD-10-CM | POA: Diagnosis not present

## 2019-07-11 DIAGNOSIS — M5416 Radiculopathy, lumbar region: Secondary | ICD-10-CM | POA: Diagnosis not present

## 2019-07-11 DIAGNOSIS — M542 Cervicalgia: Secondary | ICD-10-CM | POA: Diagnosis not present

## 2019-07-15 ENCOUNTER — Encounter: Payer: Self-pay | Admitting: Medical

## 2019-07-19 DIAGNOSIS — M542 Cervicalgia: Secondary | ICD-10-CM | POA: Diagnosis not present

## 2019-07-19 DIAGNOSIS — M5412 Radiculopathy, cervical region: Secondary | ICD-10-CM | POA: Diagnosis not present

## 2019-08-13 DIAGNOSIS — M791 Myalgia, unspecified site: Secondary | ICD-10-CM | POA: Diagnosis not present

## 2019-08-13 DIAGNOSIS — M542 Cervicalgia: Secondary | ICD-10-CM | POA: Diagnosis not present

## 2019-08-13 DIAGNOSIS — M5412 Radiculopathy, cervical region: Secondary | ICD-10-CM | POA: Diagnosis not present

## 2019-08-13 DIAGNOSIS — Z8269 Family history of other diseases of the musculoskeletal system and connective tissue: Secondary | ICD-10-CM | POA: Diagnosis not present

## 2019-08-13 DIAGNOSIS — Z8261 Family history of arthritis: Secondary | ICD-10-CM | POA: Diagnosis not present

## 2019-08-14 ENCOUNTER — Encounter: Payer: BC Managed Care – PPO | Admitting: Medical

## 2019-09-19 DIAGNOSIS — M542 Cervicalgia: Secondary | ICD-10-CM | POA: Diagnosis not present

## 2019-09-19 DIAGNOSIS — M791 Myalgia, unspecified site: Secondary | ICD-10-CM | POA: Diagnosis not present

## 2019-09-19 DIAGNOSIS — M545 Low back pain: Secondary | ICD-10-CM | POA: Diagnosis not present

## 2019-09-20 DIAGNOSIS — R350 Frequency of micturition: Secondary | ICD-10-CM | POA: Diagnosis not present

## 2019-09-20 DIAGNOSIS — R351 Nocturia: Secondary | ICD-10-CM | POA: Diagnosis not present

## 2019-09-20 DIAGNOSIS — N3942 Incontinence without sensory awareness: Secondary | ICD-10-CM | POA: Diagnosis not present

## 2019-09-24 DIAGNOSIS — N3941 Urge incontinence: Secondary | ICD-10-CM | POA: Diagnosis not present

## 2019-09-24 DIAGNOSIS — R350 Frequency of micturition: Secondary | ICD-10-CM | POA: Diagnosis not present

## 2019-09-24 DIAGNOSIS — N3942 Incontinence without sensory awareness: Secondary | ICD-10-CM | POA: Diagnosis not present

## 2019-09-24 DIAGNOSIS — R351 Nocturia: Secondary | ICD-10-CM | POA: Diagnosis not present

## 2019-10-15 ENCOUNTER — Other Ambulatory Visit: Payer: Self-pay | Admitting: Family Medicine

## 2019-10-16 MED ORDER — PROAIR RESPICLICK 108 (90 BASE) MCG/ACT IN AEPB: 2.0000 | INHALATION_SPRAY | RESPIRATORY_TRACT | 0 refills | Status: DC | PRN

## 2019-10-17 ENCOUNTER — Telehealth: Payer: Self-pay | Admitting: Medical

## 2019-10-17 MED ORDER — ALBUTEROL SULFATE HFA 108 (90 BASE) MCG/ACT IN AERS: 2.0000 | INHALATION_SPRAY | RESPIRATORY_TRACT | 0 refills | Status: DC | PRN

## 2019-10-17 NOTE — Telephone Encounter (Signed)
Recv'd fax from pharmacy that ProAir respiclick not covered.  Called pt and she is ok switching to either Ventolin or Proventil.  I called pharmacy and Ventolin is preferred.  I switched & went thru insurance.

## 2019-10-24 ENCOUNTER — Other Ambulatory Visit: Payer: Self-pay | Admitting: Medical

## 2019-10-24 MED ORDER — ALBUTEROL SULFATE HFA 108 (90 BASE) MCG/ACT IN AERS: 2.0000 | INHALATION_SPRAY | Freq: Four times a day (QID) | RESPIRATORY_TRACT | 1 refills | Status: AC | PRN

## 2019-11-11 DIAGNOSIS — M542 Cervicalgia: Secondary | ICD-10-CM | POA: Diagnosis not present

## 2019-11-11 DIAGNOSIS — M545 Low back pain: Secondary | ICD-10-CM | POA: Diagnosis not present

## 2019-11-11 DIAGNOSIS — M791 Myalgia, unspecified site: Secondary | ICD-10-CM | POA: Diagnosis not present

## 2020-02-06 ENCOUNTER — Other Ambulatory Visit: Payer: Self-pay

## 2020-02-06 ENCOUNTER — Ambulatory Visit (INDEPENDENT_AMBULATORY_CARE_PROVIDER_SITE_OTHER): Payer: 59 | Admitting: Medical

## 2020-02-06 ENCOUNTER — Encounter: Payer: Self-pay | Admitting: Medical

## 2020-02-06 VITALS — BP 116/80 | HR 78 | Ht 65.0 in | Wt 162.4 lb

## 2020-02-06 DIAGNOSIS — M609 Myositis, unspecified: Secondary | ICD-10-CM

## 2020-02-06 DIAGNOSIS — Z0000 Encounter for general adult medical examination without abnormal findings: Secondary | ICD-10-CM | POA: Diagnosis not present

## 2020-02-06 DIAGNOSIS — J4540 Moderate persistent asthma, uncomplicated: Secondary | ICD-10-CM | POA: Diagnosis not present

## 2020-02-06 DIAGNOSIS — K589 Irritable bowel syndrome without diarrhea: Secondary | ICD-10-CM

## 2020-02-06 DIAGNOSIS — Z1329 Encounter for screening for other suspected endocrine disorder: Secondary | ICD-10-CM

## 2020-02-06 DIAGNOSIS — R12 Heartburn: Secondary | ICD-10-CM

## 2020-02-06 DIAGNOSIS — G8929 Other chronic pain: Secondary | ICD-10-CM

## 2020-02-06 DIAGNOSIS — J309 Allergic rhinitis, unspecified: Secondary | ICD-10-CM | POA: Diagnosis not present

## 2020-02-06 DIAGNOSIS — Z13220 Encounter for screening for lipoid disorders: Secondary | ICD-10-CM

## 2020-02-06 DIAGNOSIS — M5441 Lumbago with sciatica, right side: Secondary | ICD-10-CM

## 2020-02-06 DIAGNOSIS — R32 Unspecified urinary incontinence: Secondary | ICD-10-CM

## 2020-02-06 DIAGNOSIS — F419 Anxiety disorder, unspecified: Secondary | ICD-10-CM

## 2020-02-06 LAB — CBC WITH DIFFERENTIAL/PLATELET
Lymphocytes Absolute: 1.5 10*3/uL (ref 0.7–3.1)
Monocytes: 9 %

## 2020-02-06 LAB — LIPID PANEL

## 2020-02-06 LAB — COMPREHENSIVE METABOLIC PANEL

## 2020-02-06 NOTE — Progress Notes (Signed)
Subjective:   HPI  Andrea Boyer is a 39 y.o. female who presents for Chief Complaint  Patient presents with  . Annual Exam    with fasting labs     Patient Care Team: Beckham Buxbaum, Cleda Mccreedy as PCP - General (Family Medicine) Sees dentist Sees eye doctor Dr. Huel Cote or Dr. Denny Peon, [facility]  Concerns: Micah Flesher to [facility] regarding pains.  Her doctor there felt her issues were related to fibromyalgia.  He has her doing PT and on some medications to help.  Uses Tizanidine per ortho.  Uses this QHS daily.   Using Meloxicam every morning.  Using Lyrica BID for fibromyaliga.   Asthma - not using duoneb but just for flare up.   Using albuterol on average 2 times per week.   Still using Symbicort once daily.  Vaccines: last tetnaus booster around 3 years ago with childbirth.  Thinks she had pneumococcal vaccine at [facility].  Last pap thru gyn 2021.  Fasting today for labs.  Recently saw [facility] for incontinence    Reviewed their medical, surgical, family, social, medication, and allergy history and updated chart as appropriate.  Past Medical History:  Diagnosis Date  . Anxiety    history - no current problems  . Asthma   . Elevated prolactin level 10/2006   NEGATIVE MRI  . History of kidney stones    passes stones - no surgery required  . IBS (irritable bowel syndrome)   . PIH (pregnancy induced hypertension) 06/2011   PIH resolved after delivery   . Pneumonia 2010  . PONV (postoperative nausea and vomiting)   . Pregnancy induced hypertension   . SVD (spontaneous vaginal delivery)    x 1    Family History  Problem Relation Age of Onset  . Hypertension Mother   . Arthritis Mother   . Colon polyps Mother   . Cancer Mother 10       colon  . Diabetes Father   . Hypertension Father   . Colon polyps Father   . Stroke Father   . Diabetes Maternal Grandmother   . COPD Maternal  Grandmother   . Hypertension Maternal Grandmother   . Ovarian cysts Maternal Grandmother   . Arthritis Maternal Grandmother   . Heart disease Maternal Grandmother   . Hypertension Maternal Grandfather   . Hypertension Paternal Grandmother   . Lung cancer Paternal Grandmother   . Cancer Paternal Grandmother        lung  . Hypertension Paternal Grandfather   . Stomach cancer Paternal Grandfather   . Cancer Paternal Grandfather        stomach  . Muscular dystrophy Cousin   . Diabetes Paternal Aunt   . Anesthesia problems Neg Hx   . Depression Neg Hx   . Asthma Neg Hx      Current Outpatient Medications:  .  acetaminophen (TYLENOL) 500 MG tablet, Take 500 mg by mouth every 6 (six) hours as needed for mild pain or headache., Disp: , Rfl:  .  albuterol (VENTOLIN HFA) 108 (90 Base) MCG/ACT inhaler, Inhale 2 puffs into the lungs every 6 (six) hours as needed for wheezing or shortness of breath., Disp: 18 g, Rfl: 1 .  budesonide-formoterol (SYMBICORT) 160-4.5 MCG/ACT inhaler, Inhale 2 puffs into the lungs 2 (two) times daily., Disp: 1 Inhaler, Rfl: 0 .  ipratropium-albuterol (DUONEB) 0.5-2.5 (3) MG/3ML SOLN, Take 3 mLs by nebulization every 4 (four) hours as needed., Disp: 360  mL, Rfl: 0 .  levonorgestrel (MIRENA) 20 MCG/24HR IUD, 1 each by Intrauterine route once., Disp: , Rfl:  .  meloxicam (MOBIC) 15 MG tablet, Take 15 mg by mouth daily., Disp: , Rfl:  .  pregabalin (LYRICA) 75 MG capsule, , Disp: , Rfl:  .  tiZANidine (ZANAFLEX) 4 MG tablet, Take 4 mg by mouth 3 (three) times daily., Disp: , Rfl:  .  HYDROcodone-acetaminophen (NORCO) 5-325 MG tablet, Take 1 tablet by mouth every 6 (six) hours as needed. (Patient not taking: Reported on 02/06/2020), Disp: 12 tablet, Rfl: 0  No Known Allergies    Review of Systems Constitutional: -fever, -chills, -sweats, -unexpected weight change, -decreased appetite, -fatigue Allergy: -sneezing, -itching, -congestion Dermatology: -changing moles,  --rash, -lumps ENT: -runny nose, -ear pain, -sore throat, -hoarseness, -sinus pain, -teeth pain, - ringing in ears, -hearing loss, -nosebleeds Cardiology: -chest pain, -palpitations, -swelling, -difficulty breathing when lying flat, -waking up short of breath Respiratory: -cough, -shortness of breath, -difficulty breathing with exercise or exertion, -wheezing, -coughing up blood Gastroenterology: -abdominal pain, -nausea, -vomiting, -diarrhea, -constipation, -blood in stool, -changes in bowel movement, -difficulty swallowing or eating Hematology: -bleeding, -bruising  Musculoskeletal: -joint aches, +muscle aches, -joint swelling, -back pain, -neck pain, -cramping, -changes in gait Ophthalmology: denies vision changes, eye redness, itching, discharge Urology: -burning with urination, -difficulty urinating, -blood in urine, -urinary frequency, -urgency, -incontinence Neurology: -headache, -weakness, -tingling, -numbness, -memory loss, -falls, -dizziness Psychology: -depressed mood, -agitation, -sleep problems Breast/gyn: -breast tendnerss, -discharge, -lumps, -vaginal discharge,- irregular periods, -heavy periods     Objective:  BP 116/80   Pulse 78   Ht 5\' 5"  (1.651 m)   Wt 162 lb 6.4 oz (73.7 kg)   SpO2 98%   BMI 27.02 kg/m   General appearance: alert, no distress, WD/WN, Caucasian female Skin: scattered benign macules HEENT: normocephalic, conjunctiva/corneas normal, sclerae anicteric, PERRLA, EOMi, nares patent, no discharge or erythema, pharynx normal Oral cavity: MMM, tongue normal, teeth normal Neck: supple, no lymphadenopathy, no thyromegaly, no masses, normal ROM, no bruits Chest: non tender, normal shape and expansion Heart: RRR, normal S1, S2, no murmurs Lungs: CTA bilaterally, no wheezes, rhonchi, or rales Abdomen: +bs, soft, non tender, non distended, no masses, no hepatomegaly, no splenomegaly, no bruits Back: non tender, normal ROM, no scoliosis Musculoskeletal: upper  extremities non tender, no obvious deformity, normal ROM throughout, lower extremities non tender, no obvious deformity, normal ROM throughout Extremities: no edema, no cyanosis, no clubbing Pulses: 2+ symmetric, upper and lower extremities, normal cap refill Neurological: alert, oriented x 3, CN2-12 intact, strength normal upper extremities and lower extremities, sensation normal throughout, DTRs 2+ throughout, no cerebellar signs, gait normal Psychiatric: normal affect, behavior normal, pleasant  Breast/gyn/rectal - deferred to gynecology   Assessment and Plan :   Encounter Diagnoses  Name Primary?  . Encounter for health maintenance examination in adult Yes  . Allergic rhinitis, unspecified seasonality, unspecified trigger   . Moderate persistent asthma without complication   . Irritable bowel syndrome, unspecified type   . Anxiety   . Heartburn   . Urinary incontinence, unspecified type   . Chronic right-sided low back pain with right-sided sciatica   . Myofasciitis   . Screening for thyroid disorder   . Screening for lipid disorders    Expressed my sympathy for loss of her father earlier this year   Physical exam - discussed and counseled on healthy lifestyle, diet, exercise, preventative care, vaccinations, sick and well care, proper use of emergency dept and after hours  care, and addressed their concerns.    Health screening: Advised they see their eye doctor yearly for routine vision care. Advised they see their dentist yearly for routine dental care including hygiene visits twice yearly. See your gynecologist yearly for routine gynecological care.  Today you had a preventative care visit or wellness visit.    Topics today may have included healthy lifestyle, diet, exercise, preventative care, vaccinations, sick and well care, proper use of emergency dept and after hours care, as well as other concerns.     Recommendations: Continue to return yearly for your annual  wellness and preventative care visits.  This gives [place] a chance to discuss healthy lifestyle, exercise, vaccinations, review your chart record, and perform screenings where appropriate.  I recommend you see your eye doctor yearly for routine vision care.  I recommend you see your dentist yearly for routine dental care including hygiene visits twice yearly.   Vaccination recommendations were reviewed She is up to date on flu and covid vaccines, tetanus   Screening for cancer: Breast cancer screening: You should perform a self breast exam monthly.   We reviewed recommendations for regular mammograms and breast cancer screening.  Colon cancer screening:  Age 45yo  Cervical cancer screening: We reviewed recommendations for pap smear screening.  Will request recent pap records from gyn  Skin cancer screening: Check your skin regularly for new changes, growing lesions, or other lesions of concern Come in for evaluation if you have skin lesions of concern.  Lung cancer screening: If you have a greater than 30 pack year history of tobacco use, then you qualify for lung cancer screening with a chest CT scan  We currently don't have screenings for other cancers besides breast, cervical, colon, and lung cancers.  If you have a strong family history of cancer or have other cancer screening concerns, please let me know.    Bone health: Get at least 150 minutes of aerobic exercise weekly Get weight bearing exercise at least once weekly   Heart health: Get at least 150 minutes of aerobic exercise weekly Limit alcohol It is important to maintain a healthy blood pressure and healthy cholesterol numbers   Separate significant issues discussed: Asthma moderate persistent-continue current therapies  Allergies-continue current therapies  IBS-no recent complaint  Myofascial pain, polymyalgia concern-continue routine follow-up with orthopedics, continue current therapy  Urinary  incontinence-followed by urology   Andrea Boyer was seen today for annual exam.  Diagnoses and all orders for this visit:  Encounter for health maintenance examination in adult -     Comprehensive metabolic panel -     CBC with Differential/Platelet -     Lipid panel -     TSH  Allergic rhinitis, unspecified seasonality, unspecified trigger -     Spirometry with graph  Moderate persistent asthma without complication  Irritable bowel syndrome, unspecified type  Anxiety  Heartburn  Urinary incontinence, unspecified type  Chronic right-sided low back pain with right-sided sciatica  Myofasciitis  Screening for thyroid disorder -     TSH  Screening for lipid disorders -     Lipid panel    Follow-up pending labs, yearly for physical

## 2020-02-07 LAB — COMPREHENSIVE METABOLIC PANEL
ALT: 39 IU/L — ABNORMAL HIGH (ref 0–32)
AST: 22 IU/L (ref 0–40)
Albumin/Globulin Ratio: 2.7 — ABNORMAL HIGH (ref 1.2–2.2)
Albumin: 5.3 g/dL — ABNORMAL HIGH (ref 3.8–4.8)
BUN/Creatinine Ratio: 17 (ref 9–23)
Bilirubin Total: 0.6 mg/dL (ref 0.0–1.2)
CO2: 25 mmol/L (ref 20–29)
Calcium: 9.7 mg/dL (ref 8.7–10.2)
Chloride: 102 mmol/L (ref 96–106)
Creatinine, Ser: 0.59 mg/dL (ref 0.57–1.00)
GFR calc Af Amer: 135 mL/min/{1.73_m2} (ref >59)
GFR calc non Af Amer: 117 mL/min/{1.73_m2} (ref >59)
Globulin, Total: 2 g/dL (ref 1.5–4.5)
Glucose: 85 mg/dL (ref 65–99)
Potassium: 4.8 mmol/L (ref 3.5–5.2)
Sodium: 140 mmol/L (ref 134–144)
Total Protein: 7.3 g/dL (ref 6.0–8.5)

## 2020-02-07 LAB — CBC WITH DIFFERENTIAL/PLATELET
Basophils Absolute: 0 10*3/uL (ref 0.0–0.2)
Basos: 0 %
EOS (ABSOLUTE): 0.1 10*3/uL (ref 0.0–0.4)
Eos: 1 %
Hematocrit: 44.2 % (ref 34.0–46.6)
Hemoglobin: 15 g/dL (ref 11.1–15.9)
Immature Grans (Abs): 0 10*3/uL (ref 0.0–0.1)
Immature Granulocytes: 0 %
Lymphs: 28 %
MCH: 30.7 pg (ref 26.6–33.0)
MCHC: 33.9 g/dL (ref 31.5–35.7)
MCV: 90 fL (ref 79–97)
Monocytes Absolute: 0.5 10*3/uL (ref 0.1–0.9)
Neutrophils Absolute: 3.1 10*3/uL (ref 1.4–7.0)
Neutrophils: 62 %
Platelets: 230 10*3/uL (ref 150–450)
RBC: 4.89 x10E6/uL (ref 3.77–5.28)
RDW: 12.2 % (ref 11.7–15.4)
WBC: 5.1 10*3/uL (ref 3.4–10.8)

## 2020-02-07 LAB — LIPID PANEL
Chol/HDL Ratio: 3.1 ratio (ref 0.0–4.4)
Cholesterol, Total: 168 mg/dL (ref 100–199)
HDL: 54 mg/dL (ref >39)
LDL Chol Calc (NIH): 102 mg/dL — ABNORMAL HIGH (ref 0–99)
Triglycerides: 62 mg/dL (ref 0–149)
VLDL Cholesterol Cal: 12 mg/dL (ref 5–40)

## 2020-02-07 LAB — TSH: TSH: 1.82 u[IU]/mL (ref 0.450–4.500)

## 2020-02-20 ENCOUNTER — Telehealth: Payer: Self-pay | Admitting: Medical

## 2020-02-20 NOTE — Telephone Encounter (Signed)
Received requested records from [facility] 

## 2020-02-20 NOTE — Telephone Encounter (Signed)
Received requested records from [facility]

## 2020-02-27 ENCOUNTER — Encounter: Payer: Self-pay | Admitting: Medical

## 2020-04-08 ENCOUNTER — Other Ambulatory Visit: Payer: Self-pay

## 2020-04-08 ENCOUNTER — Other Ambulatory Visit: Payer: 59

## 2020-04-08 DIAGNOSIS — R899 Unspecified abnormal finding in specimens from other organs, systems and tissues: Secondary | ICD-10-CM

## 2020-04-08 LAB — BASIC METABOLIC PANEL
BUN/Creatinine Ratio: 19 (ref 9–23)
BUN: 14 mg/dL (ref 6–20)
CO2: 23 mmol/L (ref 20–29)
Calcium: 9.8 mg/dL (ref 8.7–10.2)
Chloride: 105 mmol/L (ref 96–106)
Creatinine, Ser: 0.72 mg/dL (ref 0.57–1.00)
GFR calc Af Amer: 124 mL/min/{1.73_m2} (ref >59)
GFR calc non Af Amer: 107 mL/min/{1.73_m2} (ref >59)
Glucose: 81 mg/dL (ref 65–99)
Potassium: 4.5 mmol/L (ref 3.5–5.2)
Sodium: 141 mmol/L (ref 134–144)

## 2020-04-09 LAB — HEPATIC FUNCTION PANEL
ALT: 25 IU/L (ref 0–32)
AST: 13 IU/L (ref 0–40)
Albumin: 5 g/dL — ABNORMAL HIGH (ref 3.8–4.8)
Alkaline Phosphatase: 45 IU/L (ref 44–121)
Bilirubin Total: 0.5 mg/dL (ref 0.0–1.2)
Bilirubin, Direct: 0.13 mg/dL (ref 0.00–0.40)
Total Protein: 7.3 g/dL (ref 6.0–8.5)

## 2020-04-09 LAB — SPECIMEN STATUS REPORT

## 2020-05-13 ENCOUNTER — Telehealth (INDEPENDENT_AMBULATORY_CARE_PROVIDER_SITE_OTHER): Payer: 59 | Admitting: Medical

## 2020-05-13 ENCOUNTER — Other Ambulatory Visit: Payer: Self-pay

## 2020-05-13 ENCOUNTER — Encounter: Payer: Self-pay | Admitting: Medical

## 2020-05-13 ENCOUNTER — Telehealth: Payer: Self-pay | Admitting: Medical

## 2020-05-13 VITALS — Ht 65.0 in | Wt 160.0 lb

## 2020-05-13 DIAGNOSIS — G8929 Other chronic pain: Secondary | ICD-10-CM

## 2020-05-13 DIAGNOSIS — R519 Headache, unspecified: Secondary | ICD-10-CM | POA: Diagnosis not present

## 2020-05-13 DIAGNOSIS — M5441 Lumbago with sciatica, right side: Secondary | ICD-10-CM

## 2020-05-13 DIAGNOSIS — F419 Anxiety disorder, unspecified: Secondary | ICD-10-CM | POA: Diagnosis not present

## 2020-05-13 DIAGNOSIS — M609 Myositis, unspecified: Secondary | ICD-10-CM | POA: Diagnosis not present

## 2020-05-13 DIAGNOSIS — J4540 Moderate persistent asthma, uncomplicated: Secondary | ICD-10-CM

## 2020-05-13 MED ORDER — SUMATRIPTAN SUCCINATE 50 MG PO TABS: 50.0000 mg | ORAL_TABLET | ORAL | 0 refills | Status: DC | PRN

## 2020-05-13 MED ORDER — EMGALITY 120 MG/ML ~~LOC~~ SOAJ: 120.0000 mg | SUBCUTANEOUS | 2 refills | Status: DC

## 2020-05-13 NOTE — Progress Notes (Signed)
Subjective: Chief Complaint  Patient presents with  . Migraine   This visit type was conducted due to national recommendations for restrictions regarding the COVID-19 Pandemic (e.g. social distancing) in an effort to limit this patient's exposure and mitigate transmission in our community.  Due to their co-morbid illnesses, this patient is at least at moderate risk for complications without adequate follow up.  This format is felt to be most appropriate for this patient at this time.    Documentation for virtual audio and video telecommunications through [place] encounter:  The patient was located at home. The provider was located in the office. The patient did consent to this visit and is aware of possible charges through their insurance for this visit.  The other persons participating in this telemedicine service were none. Time spent on call was 32 minutes and in review of previous records >40 minutes total.  This virtual service is not related to other E/M service within previous 7 days.  Dr. Lennie Hummer, pain/ortho specialist Quantae Martel, Kermit Balo, PA-C here for primary care  Virtual consult for worsening migraines.  She has a long history of migraines.  Lately in recent months they have gotten progressively worse.  Getting several per week often 4 to 5 days/week.  Often they can start in the back of the head or get pain in the neck and shoulders but often are headaches however.  Last week she had one headache that was unilateral had some nausea and photophobia.    She sees a pain specialist for chronic pain, neck and shoulder pains, low back pains. She is currently taking Pregabalin and Meloxicam per specialist.  She hasn't seen neurology in years.  No new numbness, but has ongoing numbness and tingling in legs from back issues  Gets occasional urinary leakage with laugh or cough, but no new incontinence, no saddle anesthesia.  asthma is stable, using her regular  prevention medicaiton and uses albuterol as needed.  She doesn't skin meals.  Sleep was a problem but currently doing better with sleep.  No recent head imaging.    Denies aura.  Has Mirena IUD  Has used imitrex and similar abortive therapy in the past, has been on several different preventative headache medicaiton in the past.  Year ago spinal injection C spine 05/2019.  Sees pain specialist regularly, No massage in a few months  Past Medical History:  Diagnosis Date  . Anxiety    history - no current problems  . Asthma   . Elevated prolactin level 10/2006   NEGATIVE MRI  . History of kidney stones    passes stones - no surgery required  . IBS (irritable bowel syndrome)   . Migraine    x many years  . PIH (pregnancy induced hypertension) 06/2011   PIH resolved after delivery   . Pneumonia 2010  . PONV (postoperative nausea and vomiting)   . Pregnancy induced hypertension   . SVD (spontaneous vaginal delivery)    x 1   Current Outpatient Medications on File Prior to Visit  Medication Sig Dispense Refill  . acetaminophen (TYLENOL) 500 MG tablet Take 500 mg by mouth every 6 (six) hours as needed for mild pain or headache.    . albuterol (VENTOLIN HFA) 108 (90 Base) MCG/ACT inhaler Inhale 2 puffs into the lungs every 6 (six) hours as needed for wheezing or shortness of breath. 18 g 1  . budesonide-formoterol (SYMBICORT) 160-4.5 MCG/ACT inhaler Inhale 2 puffs into the lungs 2 (two) times daily.  1 Inhaler 0  . ipratropium-albuterol (DUONEB) 0.5-2.5 (3) MG/3ML SOLN Take 3 mLs by nebulization every 4 (four) hours as needed. 360 mL 0  . levonorgestrel (MIRENA) 20 MCG/24HR IUD 1 each by Intrauterine route once.    . meloxicam (MOBIC) 15 MG tablet Take 15 mg by mouth daily.    . pregabalin (LYRICA) 75 MG capsule     . tiZANidine (ZANAFLEX) 4 MG tablet Take 4 mg by mouth 3 (three) times daily.    Marland Kitchen HYDROcodone-acetaminophen (NORCO) 5-325 MG tablet Take 1 tablet by mouth every 6 (six)  hours as needed. (Patient not taking: No sig reported) 12 tablet 0   No current facility-administered medications on file prior to visit.    Review of Systems As in subjective    Objective:   Physical Exam Due to coronavirus pandemic stay at home measures, patient visit was virtual and they were not examined in person.   Ht 5\' 5"  (1.651 m)   Wt 160 lb (72.6 kg)   BMI 26.63 kg/m   Pleasant, answers questions appropriately      Assessment:     Encounter Diagnoses  Name Primary?  . Chronic intractable headache, unspecified headache type Yes  . Myofasciitis   . Moderate persistent asthma without complication   . Anxiety   . Chronic right-sided low back pain with right-sided sciatica        Plan:     We discussed her worsening headaches.    I recommend we begin trial of Emgality for headache prevention.  Can begin back on Imitrex for abortive therapy.  Discussed risks and benefits of medications.  Will go ahead and refer to neurology for further eval and management.  Reduce stress where possible  Consider massage therapy  C/t routine f/u with her orthopedist.  Asthma - controlled on current therapy   Eldonna was seen today for migraine.  Diagnoses and all orders for this visit:  Chronic intractable headache, unspecified headache type -     Ambulatory referral to Neurology  Myofasciitis  Moderate persistent asthma without complication  Anxiety  Chronic right-sided low back pain with right-sided sciatica  Other orders -     SUMAtriptan (IMITREX) 50 MG tablet; Take 1 tablet (50 mg total) by mouth every 2 (two) hours as needed for migraine. May repeat in 2 hours if headache persists or recurs. -     Galcanezumab-gnlm (EMGALITY) 120 MG/ML SOAJ; Inject 120 mg into the skin every 30 (thirty) days.    f/u pending call back

## 2020-05-13 NOTE — Telephone Encounter (Signed)
Please call her about the following recommendations:   Begin Emgality monthly injection to help control or prevent migraines.  Either come in for demonstration, watch the how to use video online on you tube from the manufacturer, or have the pharmacist give you tutorial on this medicaiton and side effect potential.  I think this overcall can be a good medicaiton with less side effects than other prevention options.   Obvious if any rash, swelling or other immediate side effect, stop the medicaiton and get medical help right away.   Begin Imitrex as needed to abort acute headaches when a bad headache starts up.   This medicaiton can possibly make you sleepy.  You can take 1 initially, and if headache not improving, you can repeat this once in 2 hours.  No more than 2 of these in a day   If either medication not covered by insurance or too expensive, let me know ASAP  I am going to go ahead and refer you to neurology as well for further evaluation and treatment recommendations and to follow up on these new medications since your headaches are so frequent

## 2020-05-13 NOTE — Patient Instructions (Signed)
Hello Andrea Boyer  [employer] monthly injection to help control or prevent migraines.  Either come in for demonstration, watch the how to use video online on you tube from the manufacturer, or have the pharmacist give you tutorial on this medicaiton and side effect potential.  I think this overcall can be a good medicaiton with less side effects than other prevention options.   Obvious if any rash, swelling or other immediate side effect, stop the medicaiton and get medical help right away.   Begin Imitrex as needed to abort acute headaches when a bad headache starts up.   This medicaiton can possibly make you sleepy.  You can take 1 initially, and if headache not improving, you can repeat this once in 2 hours.  No more than 2 of these in a day   If either medication not covered by insurance or too expensive, let me know ASAP  I am going to go ahead and refer you to neurology as well for further evaluation and treatment recommendations and to follow up on these new medications since your headaches are so frequent

## 2020-05-14 ENCOUNTER — Encounter: Payer: Self-pay | Admitting: Neurology

## 2020-05-14 NOTE — Telephone Encounter (Signed)
Patient has been informed.

## 2020-05-17 ENCOUNTER — Telehealth: Payer: Self-pay

## 2020-05-17 NOTE — Telephone Encounter (Signed)
P.A. Ronnell [place]

## 2020-05-23 NOTE — Telephone Encounter (Signed)
P.A. approved til 11/20/21 sent my chart message

## 2020-07-16 NOTE — Progress Notes (Signed)
NEUROLOGY CONSULTATION NOTE  Andrea Boyer MRN: [id number] DOB: 1980-12-01  Referring provider: Crosby Oyster, PA-C Primary care provider: Crosby Oyster, PA-C  Reason for consult:  migraines  Assessment/Plan:   1.  Chronic headaches, possibly migraines -worsening 2.  Chronic pain syndrome - burning, numbness and paresthesias, polyarthralgia 3.  Family history of cerebral aneurysm (Father)  1.  Start nortriptyline 10mg  QHS for one week, then 20mg  at bedtime.  May increase dose to 50mg  in 5 weeks if needed. 2.  Stop sumatriptan.  Try rizatriptan 10mg  3.  Limit use of pain relievers to no more than 2 days out of week to prevent risk of rebound or medication-overuse headache. 4.  Keep headache diary 5.  Check MRA of head to evaluate for aneurysm 6.  NCV-EMG right upper and lower extremities 7.  Follow up 6 months.   Subjective:  Andrea Boyer is a 40 year old right-handed female with asthma and history of kidney stones who presents for migraines.  History supplemented by referring provider's note.  History of migraines since 52 or 40 years old.  Over the past several months, they have progressively gotten worse.  She describes severe stabbing pain usually across occipital region, other times diffuse.  There is associated photophobia, visual disturbance (bright yellow circles) and sometimes nausea and phonophobia.  They typically last 1-2 to 4- 5 days and occur 3 to 4 times a month.  However, she has a daily dull headache.  Imitrex and rest helps.  Previously took Tylenol frequently.  Missed 2-3 days of work in April  Remote MRI of brain from 09/27/2002 was reportedly unremarkable except for one possible punctate hyperintensity above the right lateral ventricle.    Current NSAIDS/analgesics:  Tylenol, meloxicam (chronic pain) Current triptans:  Sumatriptan 50mg  Current ergotamine:  none Current anti-emetic:  none Current muscle relaxants: tizanidine 4mg  TID Current  Antihypertensive medications:  none Current Antidepressant medications:  none Current Anticonvulsant medications:  Lyrica 75mg  BID (chronic pain) Current anti-CGRP:  none Current Vitamins/Herbal/Supplements:  none Current Antihistamines/Decongestants:  none Other therapy:  none Hormone/birth control:  Mirena Other medications:  none  Past NSAIDS/analgesics:  Norco Past abortive triptans:  none Past abortive ergotamine:  none Past muscle relaxants:  Robaxin Past anti-emetic:  none Past antihypertensive medications:  none Past antidepressant medications:  none Past anticonvulsant medications:  none Past anti-CGRP:  none Past vitamins/Herbal/Supplements:  none Past antihistamines/decongestants:  none Other past therapies:  none  Caffeine:  1/2 to 1 cup coffee in AM.  Occasional mini Coke Zero (2 a week).  1 cup unsweet tea daily Diet:  Hydrates.  Does not skip meals Exercise:  Not routine Depression:  Overall stable; Anxiety:  Some, improved Other pain:  History of chronic pain - polyarthralgia, burning and tingling in all extremities, neck pain radiating down arms.  Saw orthopedics.  Imaging of cervical spine reportedly showed mild disc bulges but nothing significant.  Did not respond to cervical nerve blocks. Sleep hygiene:  Improved but not optimal Family history of headache:  Father - headaches.  Cerebral aneurysm      PAST MEDICAL HISTORY: Past Medical History:  Diagnosis Date  . Anxiety    history - no current problems  . Asthma   . Elevated prolactin level 10/2006   NEGATIVE MRI  . History of kidney stones    passes stones - no surgery required  . IBS (irritable bowel syndrome)   . Migraine    x many years  . PIH (pregnancy induced hypertension)  06/2011   PIH resolved after delivery   . Pneumonia 2010  . PONV (postoperative nausea and vomiting)   . Pregnancy induced hypertension   . SVD (spontaneous vaginal delivery)    x 1    PAST SURGICAL HISTORY: Past  Surgical History:  Procedure Laterality Date  . FOOT FRACTURE SURGERY Right 2004   removed a bone from a fx foot  . LAPAROSCOPIC LYSIS OF ADHESIONS N/A 11/29/2012   Procedure: LAPAROSCOPIC LYSIS OF ADHESIONS;  Surgeon: Oliver Pila, MD;  Location: [facility];  Service: Gynecology;  Laterality: N/A;  . LAPAROSCOPY N/A 11/29/2012   Procedure: LAPAROSCOPY OPERATIVE;  Surgeon: Oliver Pila, MD;  Location: [facility];  Service: Gynecology;  Laterality: N/A;  . TONSILLECTOMY  1991  . WISDOM TOOTH EXTRACTION      MEDICATIONS: Current Outpatient Medications on File Prior to Visit  Medication Sig Dispense Refill  . acetaminophen (TYLENOL) 500 MG tablet Take 500 mg by mouth every 6 (six) hours as needed for mild pain or headache.    . albuterol (VENTOLIN HFA) 108 (90 Base) MCG/ACT inhaler Inhale 2 puffs into the lungs every 6 (six) hours as needed for wheezing or shortness of breath. 18 g 1  . budesonide-formoterol (SYMBICORT) 160-4.5 MCG/ACT inhaler Inhale 2 puffs into the lungs 2 (two) times daily. 1 Inhaler 0  . Galcanezumab-gnlm (EMGALITY) 120 MG/ML SOAJ Inject 120 mg into the skin every 30 (thirty) days. 1.12 mL 2  . HYDROcodone-acetaminophen (NORCO) 5-325 MG tablet Take 1 tablet by mouth every 6 (six) hours as needed. (Patient not taking: No sig reported) 12 tablet 0  . ipratropium-albuterol (DUONEB) 0.5-2.5 (3) MG/3ML SOLN Take 3 mLs by nebulization every 4 (four) hours as needed. 360 mL 0  . levonorgestrel (MIRENA) 20 MCG/24HR IUD 1 each by Intrauterine route once.    . meloxicam (MOBIC) 15 MG tablet Take 15 mg by mouth daily.    . pregabalin (LYRICA) 75 MG capsule     . SUMAtriptan (IMITREX) 50 MG tablet Take 1 tablet (50 mg total) by mouth every 2 (two) hours as needed for migraine. May repeat in 2 hours if headache persists or recurs. 10 tablet 0  . tiZANidine (ZANAFLEX) 4 MG tablet Take 4 mg by mouth 3 (three) times daily.     No current facility-administered medications on file prior  to visit.    ALLERGIES: No Known Allergies  FAMILY HISTORY: Family History  Problem Relation Age of Onset  . Hypertension Mother   . Arthritis Mother   . Colon polyps Mother   . Cancer Mother 68       colon  . Diabetes Father   . Hypertension Father   . Colon polyps Father   . Stroke Father   . Diabetes Maternal Grandmother   . COPD Maternal Grandmother   . Hypertension Maternal Grandmother   . Ovarian cysts Maternal Grandmother   . Arthritis Maternal Grandmother   . Heart disease Maternal Grandmother   . Hypertension Maternal Grandfather   . Hypertension Paternal Grandmother   . Lung cancer Paternal Grandmother   . Cancer Paternal Grandmother        lung  . Hypertension Paternal Grandfather   . Stomach cancer Paternal Grandfather   . Cancer Paternal Grandfather        stomach  . Muscular dystrophy Cousin   . Diabetes Paternal Aunt   . Anesthesia problems Neg Hx   . Depression Neg Hx   . Asthma Neg Hx  Objective:  Blood pressure 135/89, pulse 88, height 5\' 5"  (1.651 m), weight 164 lb 9.6 oz (74.7 kg), SpO2 99 %. General: No acute distress.  Patient appears well-groomed.   Head:  Normocephalic/atraumatic Eyes:  fundi examined but not visualized Neck: supple, no paraspinal tenderness, full range of motion Back: No paraspinal tenderness Heart: regular rate and rhythm Lungs: Clear to auscultation bilaterally. Vascular: No carotid bruits. Neurological Exam: Mental status: alert and oriented to person, place, and time, recent and remote memory intact, fund of knowledge intact, attention and concentration intact, speech fluent and not dysarthric, language intact. Cranial nerves: CN I: not tested CN II: pupils equal, round and reactive to light, visual fields intact CN III, IV, VI:  full range of motion, no nystagmus, no ptosis CN V: facial sensation intact. CN VII: upper and lower face symmetric CN VIII: hearing intact CN IX, X: gag intact, uvula midline CN XI:  sternocleidomastoid and trapezius muscles intact CN XII: tongue midline Bulk & Tone: normal, no fasciculations. Motor:  muscle strength 5/5 throughout Sensation:  Pinprick, temperature and vibratory sensation intact. Deep Tendon Reflexes:  2+ throughout,  toes downgoing.   Finger to nose testing:  Without dysmetria.   Heel to shin:  Without dysmetria.   Gait:  Normal station and stride.  Romberg negative.    Thank you for allowing me to take part in the care of this patient.  , DO  CC: Shon Millet, PA-C

## 2020-07-17 ENCOUNTER — Other Ambulatory Visit: Payer: Self-pay

## 2020-07-17 ENCOUNTER — Ambulatory Visit (INDEPENDENT_AMBULATORY_CARE_PROVIDER_SITE_OTHER): Payer: 59 | Admitting: Neurology

## 2020-07-17 ENCOUNTER — Encounter: Payer: Self-pay | Admitting: Neurology

## 2020-07-17 VITALS — BP 135/89 | HR 88 | Ht 65.0 in | Wt 164.6 lb

## 2020-07-17 DIAGNOSIS — R200 Anesthesia of skin: Secondary | ICD-10-CM

## 2020-07-17 DIAGNOSIS — Z8249 Family history of ischemic heart disease and other diseases of the circulatory system: Secondary | ICD-10-CM | POA: Diagnosis not present

## 2020-07-17 DIAGNOSIS — R519 Headache, unspecified: Secondary | ICD-10-CM | POA: Diagnosis not present

## 2020-07-17 DIAGNOSIS — R202 Paresthesia of skin: Secondary | ICD-10-CM | POA: Diagnosis not present

## 2020-07-17 MED ORDER — RIZATRIPTAN BENZOATE 10 MG PO TABS: 10.0000 mg | ORAL_TABLET | ORAL | 5 refills | Status: DC | PRN

## 2020-07-17 MED ORDER — NORTRIPTYLINE HCL 10 MG PO CAPS: ORAL_CAPSULE | ORAL | 0 refills | Status: DC

## 2020-07-17 NOTE — Patient Instructions (Addendum)
1.  Start nortriptyline 10mg .  Take 1 capsule at bedtime for one week, then increase to 2 capsules at bedtime.  Contact me for refill and update and we can increase dose if needed 2.  Take rizatriptan earliest onset of headache.  May repeat in 2 hours if needed.  Max 2 tablets in 24 hours.  STOP SUMATRIPTAN 3.  Limit use of pain relievers to no more than 2 days out of week to prevent risk of rebound or medication-overuse headache. 4.  Keep headache diary. 5.  Check MRA of head 6.  Stop caffeine intake 7.  At least 64 oz water daily 8.  Nerve study of right arm and leg 9.  Follow up 6 months.

## 2020-07-27 ENCOUNTER — Ambulatory Visit
Admission: RE | Admit: 2020-07-27 | Discharge: 2020-07-27 | Disposition: A | Discharge status: Home / Self Care | Payer: 59 | Source: Ambulatory Visit | Attending: Neurology | Admitting: Neurology

## 2020-07-27 DIAGNOSIS — Z8249 Family history of ischemic heart disease and other diseases of the circulatory system: Secondary | ICD-10-CM

## 2020-07-27 DIAGNOSIS — R519 Headache, unspecified: Secondary | ICD-10-CM

## 2020-07-30 ENCOUNTER — Telehealth: Payer: Self-pay

## 2020-07-30 DIAGNOSIS — Z8249 Family history of ischemic heart disease and other diseases of the circulatory system: Secondary | ICD-10-CM

## 2020-07-30 NOTE — Telephone Encounter (Signed)
-----   Message from Drema Dallas, DO sent at 07/28/2020  3:50 PM EDT ----- I spoke with the patient about the MRI results.  There is what appears to be a very tiny aneurysm.  Based on size, it is not concerning but given its location, I would like her to be seen by interventional radiology.  She is agreeable to the plan.

## 2020-07-31 NOTE — Telephone Encounter (Signed)
Pt advised order is in and faxed.  Pt advised.

## 2020-07-31 NOTE — Telephone Encounter (Signed)
Patient called and said she wanted to confirm the referral was put in. She called [facility] and was told they do not have the referral.

## 2020-08-03 ENCOUNTER — Other Ambulatory Visit (HOSPITAL_COMMUNITY): Payer: Self-pay | Admitting: Interventional Radiology

## 2020-08-03 DIAGNOSIS — I671 Cerebral aneurysm, nonruptured: Secondary | ICD-10-CM

## 2020-08-04 ENCOUNTER — Other Ambulatory Visit: Payer: Self-pay | Admitting: Radiology

## 2020-08-05 ENCOUNTER — Telehealth (HOSPITAL_COMMUNITY): Payer: Self-pay

## 2020-08-05 ENCOUNTER — Other Ambulatory Visit: Payer: Self-pay | Admitting: Student

## 2020-08-05 ENCOUNTER — Other Ambulatory Visit: Payer: Self-pay | Admitting: Radiology

## 2020-08-05 NOTE — Telephone Encounter (Signed)
Called to reschedule angio, no answer, left vm. AW 

## 2020-08-06 ENCOUNTER — Telehealth (HOSPITAL_COMMUNITY): Payer: Self-pay

## 2020-08-06 ENCOUNTER — Other Ambulatory Visit: Payer: Self-pay | Admitting: Radiology

## 2020-08-06 ENCOUNTER — Ambulatory Visit (HOSPITAL_COMMUNITY): Admission: RE | Admit: 2020-08-06 | Payer: 59 | Source: Ambulatory Visit

## 2020-08-06 NOTE — Telephone Encounter (Signed)
Called to see if pt wanted to come in for angiogram tomorrow instead of Tuesday, no answer, left vm. AW

## 2020-08-07 ENCOUNTER — Other Ambulatory Visit (HOSPITAL_COMMUNITY): Payer: Self-pay | Admitting: Interventional Radiology

## 2020-08-07 ENCOUNTER — Ambulatory Visit (HOSPITAL_COMMUNITY)
Admission: RE | Admit: 2020-08-07 | Discharge: 2020-08-07 | Disposition: A | Discharge status: Home / Self Care | Payer: 59 | Source: Ambulatory Visit | Attending: Interventional Radiology | Admitting: Interventional Radiology

## 2020-08-07 ENCOUNTER — Encounter (HOSPITAL_COMMUNITY): Payer: Self-pay

## 2020-08-07 ENCOUNTER — Other Ambulatory Visit: Payer: Self-pay

## 2020-08-07 DIAGNOSIS — I671 Cerebral aneurysm, nonruptured: Secondary | ICD-10-CM | POA: Insufficient documentation

## 2020-08-07 DIAGNOSIS — Z975 Presence of (intrauterine) contraceptive device: Secondary | ICD-10-CM | POA: Insufficient documentation

## 2020-08-07 DIAGNOSIS — Z79899 Other long term (current) drug therapy: Secondary | ICD-10-CM | POA: Insufficient documentation

## 2020-08-07 DIAGNOSIS — Z7951 Long term (current) use of inhaled steroids: Secondary | ICD-10-CM | POA: Diagnosis not present

## 2020-08-07 DIAGNOSIS — Z8759 Personal history of other complications of pregnancy, childbirth and the puerperium: Secondary | ICD-10-CM | POA: Insufficient documentation

## 2020-08-07 DIAGNOSIS — Z8249 Family history of ischemic heart disease and other diseases of the circulatory system: Secondary | ICD-10-CM | POA: Insufficient documentation

## 2020-08-07 HISTORY — PX: IR ANGIO VERTEBRAL SEL SUBCLAVIAN INNOMINATE BILAT MOD SED: IMG5366

## 2020-08-07 HISTORY — PX: IR ANGIO INTRA EXTRACRAN SEL COM CAROTID INNOMINATE BILAT MOD SED: IMG5360

## 2020-08-07 HISTORY — PX: IR US GUIDE VASC ACCESS RIGHT: IMG2390

## 2020-08-07 LAB — BASIC METABOLIC PANEL
Anion gap: 7 (ref 5–15)
BUN: 15 mg/dL (ref 6–20)
CO2: 26 mmol/L (ref 22–32)
Calcium: 9.6 mg/dL (ref 8.9–10.3)
Chloride: 104 mmol/L (ref 98–111)
Creatinine, Ser: 0.67 mg/dL (ref 0.44–1.00)
GFR, Estimated: >60 mL/min (ref >60)
Glucose, Bld: 95 mg/dL (ref 70–99)
Potassium: 3.8 mmol/L (ref 3.5–5.1)
Sodium: 137 mmol/L (ref 135–145)

## 2020-08-07 LAB — CBC WITH DIFFERENTIAL/PLATELET
Abs Immature Granulocytes: 0.01 10*3/uL (ref 0.00–0.07)
Basophils Absolute: 0 10*3/uL (ref 0.0–0.1)
Basophils Relative: 0 %
Eosinophils Absolute: 0.1 10*3/uL (ref 0.0–0.5)
Eosinophils Relative: 1 %
HCT: 45.3 % (ref 36.0–46.0)
Hemoglobin: 15.3 g/dL — ABNORMAL HIGH (ref 12.0–15.0)
Immature Granulocytes: 0 %
Lymphocytes Relative: 27 %
Lymphs Abs: 1.8 10*3/uL (ref 0.7–4.0)
MCH: 30.5 pg (ref 26.0–34.0)
MCHC: 33.8 g/dL (ref 30.0–36.0)
MCV: 90.2 fL (ref 80.0–100.0)
Monocytes Absolute: 0.5 10*3/uL (ref 0.1–1.0)
Monocytes Relative: 7 %
Neutro Abs: 4.3 10*3/uL (ref 1.7–7.7)
Neutrophils Relative %: 65 %
Platelets: 208 10*3/uL (ref 150–400)
RBC: 5.02 MIL/uL (ref 3.87–5.11)
RDW: 11.9 % (ref 11.5–15.5)
WBC: 6.7 10*3/uL (ref 4.0–10.5)
nRBC: 0 % (ref 0.0–0.2)

## 2020-08-07 LAB — PROTIME-INR
INR: 1 (ref 0.8–1.2)
Prothrombin Time: 13 seconds (ref 11.4–15.2)

## 2020-08-07 MED ORDER — MIDAZOLAM HCL 2 MG/2ML IJ SOLN
INTRAMUSCULAR | Status: AC | PRN
  Administered 2020-08-07: 1 mg via INTRAVENOUS

## 2020-08-07 MED ORDER — SODIUM CHLORIDE 0.9 % IV SOLN: INTRAVENOUS | Status: DC

## 2020-08-07 MED ORDER — LIDOCAINE HCL 1 % IJ SOLN
INTRAMUSCULAR | Status: AC
  Filled 2020-08-07: qty 20

## 2020-08-07 MED ORDER — IOHEXOL 240 MG/ML SOLN
INTRAMUSCULAR | Status: AC
  Filled 2020-08-07: qty 100

## 2020-08-07 MED ORDER — FENTANYL CITRATE (PF) 100 MCG/2ML IJ SOLN
INTRAMUSCULAR | Status: AC
  Filled 2020-08-07: qty 2

## 2020-08-07 MED ORDER — IOHEXOL 300 MG/ML  SOLN
100.0000 mL | Freq: Once | INTRAMUSCULAR | Status: AC | PRN
  Administered 2020-08-07: 20 mL via INTRAVENOUS

## 2020-08-07 MED ORDER — MIDAZOLAM HCL 2 MG/2ML IJ SOLN
INTRAMUSCULAR | Status: AC
  Filled 2020-08-07: qty 2

## 2020-08-07 MED ORDER — IOHEXOL 240 MG/ML SOLN
150.0000 mL | Freq: Once | INTRAMUSCULAR | Status: AC | PRN
  Administered 2020-08-07: 50 mL via INTRAVENOUS

## 2020-08-07 MED ORDER — FENTANYL CITRATE (PF) 100 MCG/2ML IJ SOLN
INTRAMUSCULAR | Status: AC | PRN
  Administered 2020-08-07: 25 ug via INTRAVENOUS

## 2020-08-07 MED ORDER — HEPARIN SODIUM (PORCINE) 1000 UNIT/ML IJ SOLN
INTRAMUSCULAR | Status: AC
  Filled 2020-08-07: qty 1

## 2020-08-07 MED ORDER — VERAPAMIL HCL 2.5 MG/ML IV SOLN: INTRA_ARTERIAL | Status: AC | PRN

## 2020-08-07 MED ORDER — NITROGLYCERIN 1 MG/10 ML FOR IR/CATH LAB
INTRA_ARTERIAL | Status: AC
  Filled 2020-08-07: qty 10

## 2020-08-07 MED ORDER — SODIUM CHLORIDE 0.9 % IV BOLUS
INTRAVENOUS | Status: AC | PRN
  Administered 2020-08-07: 250 mL via INTRAVENOUS

## 2020-08-07 MED ORDER — SODIUM CHLORIDE 0.9 % IV SOLN: INTRAVENOUS | Status: AC

## 2020-08-07 MED ORDER — VERAPAMIL HCL 2.5 MG/ML IV SOLN
INTRAVENOUS | Status: AC
  Filled 2020-08-07: qty 2

## 2020-08-07 NOTE — Procedures (Signed)
S/P 4 vessel  Cerebral arteriogram RT rad approach  Findings. 1.No angio evidence of intracranial aneurysm or AV shunting or dissection. 2.Venous outflow WNL. S.Myrick Mcnairy MD

## 2020-08-07 NOTE — Discharge Instructions (Addendum)
Radial Site Care  This sheet gives you information about how to care for yourself after your procedure. Your health care provider may also give you more specific instructions. If you have problems or questions, contact your health care provider. What can I expect after the procedure? After the procedure, it is common to have:  Bruising and tenderness at the catheter insertion area. Follow these instructions at home: Medicines  Take over-the-counter and prescription medicines only as told by your health care provider. Insertion site care  Follow instructions from your health care provider about how to take care of your insertion site. Make sure you: ? Wash your hands with soap and water before you change your bandage (dressing). If soap and water are not available, use hand sanitizer. ? Change your dressing as told by your health care provider. ? Leave stitches (sutures), skin glue, or adhesive strips in place. These skin closures may need to stay in place for 2 weeks or longer. If adhesive strip edges start to loosen and curl up, you may trim the loose edges. Do not remove adhesive strips completely unless your health care provider tells you to do that.  Check your insertion site every day for signs of infection. Check for: ? Redness, swelling, or pain. ? Fluid or blood. ? Pus or a bad smell. ? Warmth.  Do not take baths, swim, or use a hot tub until your health care provider approves.  You may shower 24-48 hours after the procedure, or as directed by your health care provider. ? Remove the dressing and gently wash the site with plain soap and water. ? Pat the area dry with a clean towel. ? Do not rub the site. That could cause bleeding.  Do not apply powder or lotion to the site. Activity  For 24 hours after the procedure, or as directed by your health care provider: ? Do not flex or bend the affected arm. ? Do not push or pull heavy objects with the affected arm. ? Do not drive  yourself home from the hospital or clinic. You may drive 24 hours after the procedure unless your health care provider tells you not to. ? Do not operate machinery or power tools.  Do not lift anything that is heavier than 10 lb (4.5 kg), or the limit that you are told, until your health care provider says that it is safe.  Ask your health care provider when it is okay to: ? Return to work or school. ? Resume usual physical activities or sports. ? Resume sexual activity.   General instructions  If the catheter site starts to bleed, raise your arm and put firm pressure on the site. If the bleeding does not stop, get help right away. This is a medical emergency.  If you went home on the same day as your procedure, a responsible adult should be with you for the first 24 hours after you arrive home.  Keep all follow-up visits as told by your health care provider. This is important. Contact a health care provider if:  You have a fever.  You have redness, swelling, or yellow drainage around your insertion site. Get help right away if:  You have unusual pain at the radial site.  The catheter insertion area swells very fast.  The insertion area is bleeding, and the bleeding does not stop when you hold steady pressure on the area.  Your arm or hand becomes pale, cool, tingly, or numb. These symptoms may represent a serious   problem that is an emergency. Do not wait to see if the symptoms will go away. Get medical help right away. Call your local emergency services (911 in the [place] Do not drive yourself to the hospital. Summary  After the procedure, it is common to have bruising and tenderness at the site.  Follow instructions from your health care provider about how to take care of your radial site wound. Check the wound every day for signs of infection.  Do not lift anything that is heavier than 10 lb (4.5 kg), or the limit that you are told, until your health care provider says that it  is safe. This information is not intended to replace advice given to you by your health care provider. Make sure you discuss any questions you have with your health care provider. Document Revised: 04/12/2017 Document Reviewed: 04/12/2017 Else[employer] Patient Education  2021 [employer] Cerebral Angiogram, Care After This sheet gives you information about how to care for yourself after your procedure. Your health care provider may also give you more specific instructions. If you have problems or questions, contact your health care provider. What can I expect after the procedure? After the procedure, it is common to have:  Bruising and tenderness at the catheter insertion site.  A mild headache. Follow these instructions at home: Insertion site care  Follow instructions from your health care provider about how to take care of the insertion site. Make sure you: ? Wash your hands with soap and water before and after you change your bandage (dressing). If soap and water are not available, use hand sanitizer. ? Change your dressing as told by your health care provider.  Do not take baths, swim, or use a hot tub until your health care provider approves. You may shower 24-48 hours after the procedure, or as told by your health care provider.  To clean your insertion site: ? Gently wash the site with plain soap and water. ? Pat the area dry with a clean towel. ? Do not rub the site. This may cause bleeding.  Do not apply powder or lotion to the site. Keep the site clean and dry. Infection signs Check your incision area every day for signs of infection. Check for:  Redness, swelling, or pain.  Fluid or blood.  Warmth.  Pus or a bad smell.   Activity  Do not drive for 24 hours if you were given a sedative during your procedure.  Rest as told by your health care provider.  Do not lift anything that is heavier than 10 lb (4.5 kg), or the limit that you are told, until your health care  provider says that it is safe.  Return to your normal activities as told by your health care provider, usually in about a week. Ask your health care provider what activities are safe for you. General instructions  If your insertion site starts to bleed, lie flat and put pressure on the site. If the bleeding does not stop, get help right away. This is a medical emergency.  Do not use any products that contain nicotine or tobacco, such as cigarettes, e-cigarettes, and chewing tobacco. If you need help quitting, ask your health care provider.  Take over-the-counter and prescription medicines only as told by your health care provider.  Drink enough fluid to keep your urine pale yellow. This helps flush the contrast dye from your body.  Keep all follow-up visits as directed by your health care provider. This is important.     Contact a health care provider if:  You have a fever or chills.  You have redness, swelling, or pain around your insertion site.  You have fluid or blood coming from your insertion site.  The insertion site feels warm to the touch.  You have pus or a bad smell coming from your insertion site.  You notice blood collecting in the tissue around the insertion site (hematoma). The hematoma may be painful to the touch. Get help right away if:  You have chest pain or trouble breathing.  You have severe pain or swelling at the insertion site.  The insertion area bleeds, and bleeding continues after 30 minutes of holding steady pressure on the site.  The arm or leg where the catheter was inserted is pale, cold, numb, tingling, or weak.  You have a rash.  You have any symptoms of a stroke. "BE FAST" is an easy way to remember the main warning signs of a stroke: ? B - Balance. Signs are dizziness, sudden trouble walking, or loss of balance. ? E - Eyes. Signs are trouble seeing or a sudden change in vision. ? F - Face. Signs are sudden weakness or numbness of the face, or  the face or eyelid drooping on one side. ? A - Arms. Signs are weakness or numbness in an arm. This happens suddenly and usually on one side of the body. ? S - Speech. Signs are sudden trouble speaking, slurred speech, or trouble understanding what people say. ? T - Time. Time to call emergency services. Write down what time symptoms started.  You have other signs of a stroke, such as: ? A sudden, severe headache with no known cause. ? Nausea or vomiting. ? Seizure. These symptoms may represent a serious problem that is an emergency. Do not wait to see if the symptoms will go away. Get medical help right away. Call your local emergency services (911 in the [place] Do not drive yourself to the hospital. Summary  Bruising and tenderness at the insertion site are common.  Follow your health care provider's instructions about caring for your insertion site. Change dressing and clean the area as instructed.  If your insertion site bleeds, apply direct pressure until bleeding stops.  Return to your normal activities as told by your health care provider. Ask what activities are safe.  Rest and drink plenty of fluids. This information is not intended to replace advice given to you by your health care provider. Make sure you discuss any questions you have with your health care provider. Document Revised: 09/25/2018 Document Reviewed: 09/25/2018 Else[employer] Patient Education  2021 [employer] Moderate Conscious Sedation, Adult Sedation is the use of medicines to promote relaxation and to relieve discomfort and anxiety. Moderate conscious sedation is a type of sedation. Under moderate conscious sedation, you are less alert than normal, but you are still able to respond to instructions, touch, or both. Moderate conscious sedation is used during short medical and dental procedures. It is milder than deep sedation, which is a type of sedation under which you cannot be easily woken up. It is also milder  than general anesthesia, which is the use of medicines to make you unconscious. Moderate conscious sedation allows you to return to your regular activities sooner. Tell a health care provider about:  Any allergies you have.  All medicines you are taking, including vitamins, herbs, eye drops, creams, and over-the-counter medicines.  Any use of steroids. This includes steroids taken by mouth or as a   cream.  Any problems you or family members have had with sedatives and anesthetic medicines.  Any blood disorders you have.  Any surgeries you have had.  Any medical conditions you have, such as sleep apnea.  Whether you are pregnant or may be pregnant.  Any use of cigarettes, alcohol, marijuana, or drugs. What are the risks? Generally, this is a safe procedure. However, problems may occur, including:  Getting too much medicine (oversedation).  Nausea.  Allergic reaction to medicines.  Trouble breathing. If this happens, a breathing tube may be used. It will be removed when you are awake and breathing on your own.  Heart trouble.  Lung trouble.  Confusion that gets better with time (emergence delirium). What happens before the procedure? Staying hydrated Follow instructions from your health care provider about hydration, which may include:  Up to 2 hours before the procedure - you may continue to drink clear liquids, such as water, clear fruit juice, black coffee, and plain tea. Eating and drinking restrictions Follow instructions from your health care provider about eating and drinking, which may include:  8 hours before the procedure - stop eating heavy meals or foods, such as meat, fried foods, or fatty foods.  6 hours before the procedure - stop eating light meals or foods, such as toast or cereal.  6 hours before the procedure - stop drinking milk or drinks that contain milk.  2 hours before the procedure - stop drinking clear liquids. Medicines Ask your health care  provider about:  Changing or stopping your regular medicines. This is especially important if you are taking diabetes medicines or blood thinners.  Taking medicines such as aspirin and ibuprofen. These medicines can thin your blood. Do not take these medicines unless your health care provider tells you to take them.  Taking over-the-counter medicines, vitamins, herbs, and supplements. Tests and exams  You will have a physical exam.  You may have blood tests done to show how well: ? Your kidneys and liver work. ? Your blood clots. General instructions  Plan to have a responsible adult take you home from the hospital or clinic.  If you will be going home right after the procedure, plan to have a responsible adult care for you for the time you are told. This is important. What happens during the procedure?  You will be given the sedative. The sedative may be given: ? As a pill that you will swallow. It can also be inserted into the rectum. ? As a spray through the nose. ? As an injection into the muscle. ? As an injection into the vein through an IV.  You may be given oxygen as needed.  Your breathing, heart rate, and blood pressure will be monitored during the procedure.  The medical or dental procedure will be done. The procedure may vary among health care providers and hospitals.   What happens after the procedure?  Your blood pressure, heart rate, breathing rate, and blood oxygen level will be monitored until you leave the hospital or clinic.  You will get fluids through your IV if needed.  Do not drive or operate machinery until your health care provider says that it is safe. Summary  Sedation is the use of medicines to promote relaxation and to relieve discomfort and anxiety. Moderate conscious sedation is a type of sedation that is used during short medical and dental procedures.  Tell the health care provider about any medical conditions that you have and about all  the   medicines that you are taking.  You will be given the sedative as a pill, a spray through the nose, an injection into the muscle, or an injection into the vein through an IV. Vital signs are monitored during the sedation.  Moderate conscious sedation allows you to return to your regular activities sooner. This information is not intended to replace advice given to you by your health care provider. Make sure you discuss any questions you have with your health care provider. Document Revised: 07/05/2019 Document Reviewed: 01/31/2019 [employer] Patient Education  2021 [employer]  

## 2020-08-07 NOTE — H&P (Signed)
Chief Complaint: Patient was seen in consultation today for a diagnostic cerebral arteriogram at the request of Dr. Everlena CooperJaffe, A.   Referring Physician(s): Dr. Everlena CooperJaffe, A.   Supervising Physician: Julieanne Cottoneveshwar, Sanjeev  Patient Status: [facility] - Out-pt  History of Present Illness: Andrea Boyer is a 40 y.o. female with past medical history with asthma, nephrolithiasis, pneumonia, IBS, and migraine. Patient was seen by her neurologist Dr. Everlena CooperJaffe on 07/17/2020 for migraines.  Patient reported family history of cerebral aneurysm and stroke, she underwent MRI angio head without contrast on 07/27/2020 which showed:  1. 1-2 mm medially projecting vascular protrusion arising from the cavernous right ICA, suspicious for aneurysm. 2. No intracranial large vessel occlusion or proximal high-grade arterial stenosis.  Due to above findings, [facility] to further evaluation.   Patient laying in bed, not in acute distress.  Patient reports mild migraine this morning, rate 4 out of 10 on the pain scale.  Migraines radiates from left temporal to right temporal. No visuals change this morning but patient does get "light flashing and black dots floating" sometimes.  Patient also reports on and off tingling sensation on her arms, relates that "I was told that I have a bulging disc in my neck so it could be that."  Patient also reports that she has been having tingling and shooting pain on bilateral legs for a while which sometimes affects her gait. Denise fever, chills, shortness of breath, cough, chest pain, abdominal pain, nausea ,vomiting, and bleeding.   Past Medical History:  Diagnosis Date  . Anxiety    history - no current problems  . Asthma   . Elevated prolactin level 10/2006   NEGATIVE MRI  . History of kidney stones    passes stones - no surgery required  . IBS (irritable bowel syndrome)   . Migraine    x many years  . PIH (pregnancy  induced hypertension) 06/2011   PIH resolved after delivery   . Pneumonia 2010  . PONV (postoperative nausea and vomiting)   . Pregnancy induced hypertension   . SVD (spontaneous vaginal delivery)    x 1    Past Surgical History:  Procedure Laterality Date  . FOOT FRACTURE SURGERY Right 2004   removed a bone from a fx foot  . LAPAROSCOPIC LYSIS OF ADHESIONS N/A 11/29/2012   Procedure: LAPAROSCOPIC LYSIS OF ADHESIONS;  Surgeon: Oliver PilaKathy W Richardson, MD;  Location: [facility];  Service: Gynecology;  Laterality: N/A;  . LAPAROSCOPY N/A 11/29/2012   Procedure: LAPAROSCOPY OPERATIVE;  Surgeon: Oliver PilaKathy W Richardson, MD;  Location: [facility];  Service: Gynecology;  Laterality: N/A;  . TONSILLECTOMY  1991  . WISDOM TOOTH EXTRACTION      Allergies: Patient has no known allergies.  Medications: Prior to Admission medications   Medication Sig Start Date End Date Taking? Authorizing Provider  acetaminophen (TYLENOL) 500 MG tablet Take 500 mg by mouth every 6 (six) hours as needed for mild pain or headache.   Yes [provider]  albuterol (VENTOLIN HFA) 108 (90 Base) MCG/ACT inhaler Inhale 2 puffs into the lungs every 6 (six) hours as needed for wheezing or shortness of breath. 10/24/19  Yes Tysinger, Kermit Baloavid S, PA-C  budesonide-formoterol (SYMBICORT) 160-4.5 MCG/ACT inhaler Inhale 2 puffs into the lungs 2 (two) times daily. 06/28/18  Yes Henson, Vickie L, NP-C  cetirizine (ZYRTEC) 10 MG tablet Take 10 mg by mouth daily as needed for allergies.   Yes [provider]  Verlee RossettiGalcanezumab-gnlm (  EMGALITY) 120 MG/ML SOAJ Inject 120 mg into the skin every 30 (thirty) days. 05/13/20  Yes Tysinger, Kermit Balo, PA-C  meloxicam (MOBIC) 15 MG tablet Take 15 mg by mouth daily. 10/02/19  Yes [provider]  nortriptyline (PAMELOR) 10 MG capsule Take 1 capsule at bedtime for one week, then 2 capsules at bedtime. Patient taking differently: Take 20 mg by mouth at bedtime. 07/17/20  Yes Jaffe, Adam R, DO   pregabalin (LYRICA) 75 MG capsule Take 75 mg by mouth 2 (two) times daily. 08/13/19  Yes [provider]  rizatriptan (MAXALT) 10 MG tablet Take 1 tablet (10 mg total) by mouth as needed for migraine (May repeat in 2 hours.  Maximum 2 tablets in 24 hours.). May repeat in 2 hours if needed Patient taking differently: Take 10 mg by mouth as needed for migraine. May repeat in 2 hours.  Maximum 2 tablets in 24 hours. 07/17/20  Yes Jaffe, Adam R, DO  tiZANidine (ZANAFLEX) 4 MG tablet Take 4 mg by mouth at bedtime. 08/13/19  Yes [provider]  ipratropium-albuterol (DUONEB) 0.5-2.5 (3) MG/3ML SOLN Take 3 mLs by nebulization every 4 (four) hours as needed. Patient taking differently: Take 3 mLs by nebulization every 4 (four) hours as needed (shortness of breath). 10/20/16   Judeth Horn, NP  levonorgestrel (MIRENA) 20 MCG/24HR IUD 1 each by Intrauterine route once.    [provider]     Family History  Problem Relation Age of Onset  . Hypertension Mother   . Arthritis Mother   . Colon polyps Mother   . Cancer Mother 41       colon  . Diabetes Father   . Hypertension Father   . Colon polyps Father   . Stroke Father   . Diabetes Maternal Grandmother   . COPD Maternal Grandmother   . Hypertension Maternal Grandmother   . Ovarian cysts Maternal Grandmother   . Arthritis Maternal Grandmother   . Heart disease Maternal Grandmother   . Hypertension Maternal Grandfather   . Hypertension Paternal Grandmother   . Lung cancer Paternal Grandmother   . Cancer Paternal Grandmother        lung  . Hypertension Paternal Grandfather   . Stomach cancer Paternal Grandfather   . Cancer Paternal Grandfather        stomach  . Muscular dystrophy Cousin   . Diabetes Paternal Aunt   . Anesthesia problems Neg Hx   . Depression Neg Hx   . Asthma Neg Hx     Social History   Socioeconomic History  . Marital status: Married    Spouse name: Not on file  . Number of children: 1   . Years of education: Not on file  . Highest education level: Not on file  Occupational History  . Occupation: Development worker, community: [employer]  Tobacco Use  . Smoking status: Never Smoker  . Smokeless tobacco: Never Used  Vaping Use  . Vaping Use: Never used  Substance and Sexual Activity  . Alcohol use: No  . Drug use: No  . Sexual activity: Yes    Partners: Male    Birth control/protection: I.U.D.    Comment: Mirena IUD 08/12/2011  Other Topics Concern  . 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  All other systems are negative.   Vital Signs: BP 125/82   Pulse 96   Temp 98.2 F (36.8 C) (Oral)   Resp 16   Ht 5\' 5"  (1.651 m)   Wt 162 lb (73.5 kg)   SpO2 98%   BMI 26.96 kg/m    MD Evaluation Airway: WNL Heart: WNL Abdomen: WNL Chest/ Lungs: WNL ASA  Classification: 2 Mallampati/Airway Score: One Physical Exam  Vitals and nursing note reviewed.  Constitutional:      General: He is not in acute distress.    Appearance: Normal appearance.  HENT:     Head: Normocephalic and atraumatic.     Mouth/Throat:     Mouth: Mucous membranes are moist.     Pharynx: Oropharynx is clear.  Cardiovascular:     Rate and Rhythm: Normal rate and regular rhythm.     Pulses: Normal pulses.     Heart sounds: Normal heart sounds.  Pulmonary:     Effort: Pulmonary effort is normal.     Breath sounds: Normal breath sounds. No wheezing, rhonchi or rales.   Abdominal:     General: Bowel sounds are normal. There is no distension.     Palpations: Abdomen is soft.  Skin:    General: Skin is warm and dry.  Neurological:     Mental Status: He is alert and oriented to person, place, and time.  Psychiatric:        Mood and Affect: Mood normal.        Behavior: Behavior normal.    Imaging: MR ANGIO HEAD WO CONTRAST  Result Date: 07/27/2020 CLINICAL DATA:  Worsening headaches. Family history of cerebral aneurysm. Headache, new or worsening, positional. Additional history provided by scanning technologist: Patient reports constant headaches for years, worsening. Patient's father head cerebral aneurysm and stroke. EXAM: MRA HEAD WITHOUT CONTRAST TECHNIQUE: Angiographic images of the Circle of Willis were acquired using MRA technique without intravenous contrast. COMPARISON:  Brain MRI 11/12/2006. Noncontrast head CT 09/15/2014. FINDINGS: Anterior circulation: The intracranial internal carotid arteries are patent. The M1 middle cerebral arteries are patent. No M2 proximal branch occlusion or high-grade proximal stenosis is identified. The anterior cerebral arteries are patent. 1-2 mm medially projecting vascular protrusion arising from the cavernous right ICA, suspicious for aneurysm (series 5, image 75) (series 107, images 158 and 159). Posterior circulation: The intracranial vertebral arteries are patent. The non dominant intracranial right vertebral artery is developmentally diminutive beyond the origin of the right PICA, but patent. The basilar artery is patent. The posterior cerebral arteries are patent. Posterior communicating arteries are present bilaterally. Anatomic variants: As described. Other: Moderate-sized left maxillary sinus mucous retention cyst. IMPRESSION: 1-2 mm medially projecting vascular protrusion arising from the cavernous right ICA, suspicious for aneurysm. No intracranial large vessel occlusion or proximal high-grade arterial stenosis.  Electronically Signed   By: 09/17/2014 DO   On: 07/27/2020 18:41    Labs:  CBC: Recent Labs    02/06/20 1112 08/07/20 0705  WBC 5.1 6.7  HGB 15.0 15.3*  HCT 44.2 45.3  PLT 230 208    COAGS: Recent  Labs    08/07/20 0705  INR 1.0    BMP: Recent Labs    02/06/20 1112 04/08/20 1154 08/07/20 0705  NA 140 141 137  K 4.8 4.5 3.8  CL 102 105 104  CO2 25 23 26   GLUCOSE 85 81 95  BUN 10 14 15   CALCIUM 9.7 9.8 9.6  CREATININE 0.59 0.72 0.67  GFRNONAA 117 107 >60  GFRAA 135 124  --     LIVER FUNCTION TESTS: Recent Labs    02/06/20 1112 04/08/20 1154  BILITOT 0.6 0.5  AST 22 13  ALT 39* 25  ALKPHOS 44 45  PROT 7.3 7.3  ALBUMIN 5.3* 5.0*    TUMOR MARKERS: No results for input(s): AFPTM, CEA, CA199, CHROMGRNA in the last 8760 hours.  Assessment and Plan: 41 y.o. female with worsening of chronic migraine who underwent MR angio head which showed a 1 to 2 mm medially projecting vascular protrusion arising from the cavernous right ICA, suspicious for aneurysm.  Patient has been referred to [facility] by her neurology to further evaluate possible brain aneurysm.  Patient presents to [facility] today for the procedure. N.p.o. since midnight Not on anticoagulation/antiplatelet VSS INR 1.0 Renal function stable, BUN 15, creatinine 0.67, GFR greater than 60 CBC stable  Risks and benefits of cerebral angiogram with intervention were discussed with the patient including, but not limited to bleeding, infection, vascular injury, contrast induced renal failure, stroke or even death.  This interventional procedure involves the use of X-rays and because of the nature of the planned procedure, it is possible that we will have prolonged use of X-ray fluoroscopy.  Potential radiation risks to you include (but are not limited to) the following: - A slightly elevated risk for cancer  several years later in life. This risk is typically less than 0.5% percent. This risk is low in comparison  to the normal incidence of human cancer, which is 33% for women and 50% for men according to the American Cancer Society. - Radiation induced injury can include skin redness, resembling a rash, tissue breakdown / ulcers and hair loss (which can be temporary or permanent).   The likelihood of either of these occurring depends on the difficulty of the procedure and whether you are sensitive to radiation due to previous procedures, disease, or genetic conditions.   IF your procedure requires a prolonged use of radiation, you will be notified and given written instructions for further action.  It is your responsibility to monitor the irradiated area for the 2 weeks following the procedure and to notify your physician if you are concerned that you have suffered a radiation induced injury.    All of the patient's questions were answered, patient is agreeable to proceed.  Consent signed and in chart.   Thank you for this interesting consult.  I greatly enjoyed meeting Andrea Boyer and look forward to participating in their care.  A copy of this report was sent to the requesting provider on this date.  Electronically Signed: [facility], PA-C 08/07/2020, 8:09 AM   I spent a total of  30 Minutes   in face to face in clinical consultation, greater than 50% of which was counseling/coordinating care for diagnostic cerebral arteriogram.

## 2020-08-07 NOTE — Sedation Documentation (Signed)
Bedside report given to [facility], RN in short stay. Vitals reviewed, medications given reviewed, radial site assessed and level 0.

## 2020-08-11 ENCOUNTER — Ambulatory Visit (HOSPITAL_COMMUNITY): Admission: RE | Admit: 2020-08-11 | Payer: 59 | Source: Ambulatory Visit

## 2020-08-27 ENCOUNTER — Encounter: Payer: Self-pay | Admitting: Medical

## 2020-08-27 ENCOUNTER — Other Ambulatory Visit: Payer: Self-pay

## 2020-08-27 ENCOUNTER — Ambulatory Visit (INDEPENDENT_AMBULATORY_CARE_PROVIDER_SITE_OTHER): Payer: 59 | Admitting: Medical

## 2020-08-27 VITALS — BP 132/88 | HR 10 | Ht 65.0 in | Wt 164.6 lb

## 2020-08-27 DIAGNOSIS — L239 Allergic contact dermatitis, unspecified cause: Secondary | ICD-10-CM

## 2020-08-27 MED ORDER — TRIAMCINOLONE ACETONIDE 0.1 % EX CREA: 1.0000 "application " | TOPICAL_CREAM | Freq: Two times a day (BID) | CUTANEOUS | 0 refills | Status: DC

## 2020-08-27 MED ORDER — CEPHALEXIN 500 MG PO CAPS: 500.0000 mg | ORAL_CAPSULE | Freq: Three times a day (TID) | ORAL | 0 refills | Status: DC

## 2020-08-27 MED ORDER — PREDNISONE 10 MG PO TABS: ORAL_TABLET | ORAL | 0 refills | Status: DC

## 2020-08-27 MED ORDER — HYDROXYZINE HCL 10 MG PO TABS: 10.0000 mg | ORAL_TABLET | Freq: Three times a day (TID) | ORAL | 0 refills | Status: DC | PRN

## 2020-08-27 NOTE — Progress Notes (Signed)
Subjective:  Andrea Boyer is a 40 y.o. female who presents for Chief Complaint  Patient presents with   Rash     Here for rash.  Started 5 days ago and has been spreading since.  Is in the yard often, and has done some bush trimming in the yard.  Has a garden, been working on this as well.   Has been using mosquito repellant.   Before rash, was very itching. First noted bumps on arms, then bumps had some drainage of pus.  Then started seeing bumps on legs, on lower abdomen, right ear.  Has continued to worsen.  Been scratching not stop.   Has some puffy red spots.   No fever, no body aches or chills.  Has used hydrocortisone cream OTC, cool showers, bath of baking soda.   No other aggravating or relieving factors.    No other c/o.  The following portions of the patient's history were reviewed and updated as appropriate: allergies, current medications, past family history, past medical history, past social history, past surgical history and problem list.  ROS Otherwise as in subjective above    Objective: BP 132/88   Pulse (!) 10   Ht 5\' 5"  (1.651 m)   Wt 164 lb 9.6 oz (74.7 kg)   SpO2 96%   BMI 27.39 kg/m   General appearance: alert, no distress, well developed, well nourished Skin: Right anterior bicep and right proximal forearm with squarish urticarial rash, several round linear urticarial lesions along forearms left ankle medially, right thigh medially, several excoriations throughout arms and legs as well, other scattered urticarial lesions of proximal lower legs hands and feet all suggestive of contact dermatitis.  No specific cellulitis appearing lesions.    Assessment: Encounter Diagnosis  Name Primary?   Allergic contact dermatitis, unspecified trigger Yes     Plan: Discussed findings, symptoms, suggestive of plant contact dermatitis.   Begin medications below, Hydroxyzine for rash and itching (hold zyrtec temporarily), begin Prednisone, can use Triamcinolone cream  to rash except not face/ears.   If any specific area of worse redness, fever, hot to touch then begin Keflex.    F/u if worse or not improving.   Andrea Boyer was seen today for rash.  Diagnoses and all orders for this visit:  Allergic contact dermatitis, unspecified trigger  Other orders -     triamcinolone cream (KENALOG) 0.1 %; Apply 1 application topically 2 (two) times daily. -     hydrOXYzine (ATARAX/VISTARIL) 10 MG tablet; Take 1 tablet (10 mg total) by mouth 3 (three) times daily as needed. -     predniSONE (DELTASONE) 10 MG tablet; 6/5/4/3/2/1 taper -     cephALEXin (KEFLEX) 500 MG capsule; Take 1 capsule (500 mg total) by mouth 3 (three) times daily.   Follow up: prn

## 2020-09-08 ENCOUNTER — Other Ambulatory Visit: Payer: Self-pay | Admitting: Neurology

## 2020-09-09 ENCOUNTER — Encounter: Payer: 59 | Admitting: Neurology

## 2020-09-15 ENCOUNTER — Ambulatory Visit (INDEPENDENT_AMBULATORY_CARE_PROVIDER_SITE_OTHER): Payer: 59 | Admitting: Neurology

## 2020-09-15 ENCOUNTER — Other Ambulatory Visit: Payer: Self-pay

## 2020-09-15 DIAGNOSIS — R200 Anesthesia of skin: Secondary | ICD-10-CM

## 2020-09-15 DIAGNOSIS — G5601 Carpal tunnel syndrome, right upper limb: Secondary | ICD-10-CM

## 2020-09-15 DIAGNOSIS — R202 Paresthesia of skin: Secondary | ICD-10-CM | POA: Diagnosis not present

## 2020-09-16 NOTE — Procedures (Signed)
[facility]  [address], [address]  [place], [place] [address] Tel: [phone] Fax:  [phone] Test Date:  09/15/2020  Patient: Andrea Boyer DOB: [birth year] Physician: Nita Sickle, DO  Sex: Female Height: 5\' 5"  Ref Phys: , D.O.  ID#: Shon Millet   Technician:    Patient Complaints: This is a 40 year old female referred for evaluation of generalized paresthesias and pain.  NCV & EMG Findings: Extensive electrodiagnostic testing of the right upper and lower extremity shows:  Right median sensory response shows latency (5.3 ms) and reduced amplitude (12.8 V).  Right ulnar, sural, and superficial peroneal sensory responses are within normal limits. Right median motor response shows prolonged latency (6.5 ms).  Right ulnar, tibial, and peroneal motor responses are within normal limits.   Right tibial H reflex study is within normal limits.   There is no evidence of active or chronic motor axonal loss changes affecting any of the tested muscles.  Motor unit configuration and recruitment pattern is within normal limits.    Impression: Right median neuropathy at or distal to the wrist, consistent with a clinical diagnosis of carpal tunnel syndrome.  Overall, these findings are moderate-to-severe in degree electrically. There is no evidence of a sensorimotor polyneuropathy or cervical/lumbosacral radiculopathy affecting the right side.   ___________________________ 20, DO    Nerve Conduction Studies Anti Sensory Summary Table   Stim Site NR Peak (ms) Norm Peak (ms) P-T Amp (V) Norm P-T Amp  Right Median Anti Sensory (2nd Digit)  34C  Wrist    5.3 <3.4 12.8 >20  Right Sup Peroneal Anti Sensory (Ant Lat Mall)  34C  12 cm    2.4 <4.5 20.1 >5  Right Sural Anti Sensory (Lat Mall)  34C  Calf    2.4 <4.5 22.1 >5  Right Ulnar Anti Sensory (5th Digit)  34C  Wrist    2.5 <3.1 51.5 >12   Motor Summary Table   Stim Site NR Onset (ms) Norm Onset  (ms) O-P Amp (mV) Norm O-P Amp Site1 Site2 Delta-0 (ms) Dist (cm) Vel (m/s) Norm Vel (m/s)  Right Median Motor (Abd Poll Brev)  34C  Wrist    6.5 <3.9 8.0 >6 Elbow Wrist 4.6 27.0 59 >50  Elbow    11.1  7.9         Right Peroneal Motor (Ext Dig Brev)  34C  Ankle    3.7 <5.5 8.8 >3 B Fib Ankle 6.7 38.0 57 >40  B Fib    10.4  8.5  Poplt B Fib 1.6 8.0 50 >40  Poplt    12.0  8.3         Right Tibial Motor (Abd Hall Brev)  34C  Ankle    3.0 <6.0 15.3 >8 Knee Ankle 8.3 42.0 51 >40  Knee    11.3  11.2         Right Ulnar Motor (Abd Dig Minimi)  34C  Wrist    2.5 <3.1 10.8 >7 B Elbow Wrist 3.0 22.0 73 >50  B Elbow    5.5  10.8  A Elbow B Elbow 1.4 10.0 71 >50  A Elbow    6.9  10.7          H Reflex Studies   NR H-Lat (ms) Lat Norm (ms) L-R H-Lat (ms)  Right Tibial (Gastroc)  34C     31.70 <35    EMG   Side Muscle Ins Act Fibs Psw Fasc Number Recrt Dur Dur.  Amp Amp. Poly Poly. Comment  Right Gastroc Nml Nml Nml Nml Nml Nml Nml Nml Nml Nml Nml Nml N/A  Right AntTibialis Nml Nml Nml Nml Nml Nml Nml Nml Nml Nml Nml Nml N/A  Right Flex Dig Long Nml Nml Nml Nml Nml Nml Nml Nml Nml Nml Nml Nml N/A  Right RectFemoris Nml Nml Nml Nml Nml Nml Nml Nml Nml Nml Nml Nml N/A  Right GluteusMed Nml Nml Nml Nml Nml Nml Nml Nml Nml Nml Nml Nml N/A  Right 1stDorInt Nml Nml Nml Nml Nml Nml Nml Nml Nml Nml Nml Nml N/A  Right Abd Poll Brev Nml Nml Nml Nml Nml Nml Nml Nml Nml Nml Nml Nml N/A  Right PronatorTeres Nml Nml Nml Nml Nml Nml Nml Nml Nml Nml Nml Nml N/A  Right Biceps Nml Nml Nml Nml Nml Nml Nml Nml Nml Nml Nml Nml N/A  Right Triceps Nml Nml Nml Nml Nml Nml Nml Nml Nml Nml Nml Nml N/A  Right Deltoid Nml Nml Nml Nml Nml Nml Nml Nml Nml Nml Nml Nml N/A      Waveforms:

## 2020-09-17 ENCOUNTER — Other Ambulatory Visit: Payer: Self-pay

## 2020-09-17 DIAGNOSIS — G5601 Carpal tunnel syndrome, right upper limb: Secondary | ICD-10-CM

## 2020-09-17 NOTE — Progress Notes (Signed)
Order placed for the hand center

## 2020-09-22 ENCOUNTER — Encounter: Payer: 59 | Admitting: Neurology

## 2020-09-29 ENCOUNTER — Telehealth: Payer: Self-pay | Admitting: Neurology

## 2020-09-29 DIAGNOSIS — G5601 Carpal tunnel syndrome, right upper limb: Secondary | ICD-10-CM

## 2020-09-29 NOTE — Telephone Encounter (Signed)
Pt called back in stating the [facility] does not take her insurance. They told her we need to refer her to someone in [place] or [place].

## 2020-09-29 NOTE — Telephone Encounter (Signed)
Telephone call to pt Advise pt to call her insurance to see who they will allow her to see for a had specialist.

## 2020-09-30 NOTE — Telephone Encounter (Signed)
New order added for [facility]. Everlene Other, MD.  Per pt

## 2020-09-30 NOTE — Telephone Encounter (Signed)
Pt called back in stating there are a few doctors at [facility] on [address] that take her insurance.

## 2020-10-07 ENCOUNTER — Telehealth: Payer: Self-pay | Admitting: Neurology

## 2020-10-07 NOTE — Telephone Encounter (Signed)
Pt called and would like the referral to be sent to [facility] wainers office. Their phone number is [phone]

## 2020-10-07 NOTE — Telephone Encounter (Signed)
LVM spoke to Andrea Boyer they do not see pt fir hand surgery.  Advised for pt to keep her appt with [facility].

## 2020-11-05 ENCOUNTER — Other Ambulatory Visit: Payer: Self-pay | Admitting: Neurology

## 2020-11-09 ENCOUNTER — Encounter: Payer: Self-pay | Admitting: Internal Medicine

## 2020-12-01 ENCOUNTER — Encounter: Payer: Self-pay | Admitting: Internal Medicine

## 2020-12-07 ENCOUNTER — Other Ambulatory Visit: Payer: Self-pay | Admitting: Neurology

## 2020-12-29 ENCOUNTER — Encounter: Payer: Self-pay | Admitting: Internal Medicine

## 2021-01-21 NOTE — Progress Notes (Signed)
Virtual Visit via Video Note The purpose of this virtual visit is to provide medical care while limiting exposure to the novel coronavirus.    Consent was obtained for video visit:  Yes.   Answered questions that patient had about telehealth interaction:  Yes.   I discussed the limitations, risks, security and privacy concerns of performing an evaluation and management service by telemedicine. I also discussed with the patient that there may be a patient responsible charge related to this service. The patient expressed understanding and agreed to proceed.  Pt location: Home Physician Location: office Name of referring provider:  Jac Canavan, PA-C I connected with Pryor Ochoa at patients initiation/request on 01/22/2021 at  8:50 AM EDT by video enabled telemedicine application and verified that I am speaking with the correct person using two identifiers. Pt MRN:  [id number] Pt DOB:  Jul 18, 1980 Video Participants:  Nehemiah Settle [place]  Assessment and Plan:   1.  Chronic migraine without aura- no secondary intracranial etiology identifed. 2.  Chronic pain syndrome- burning, numbness and paresthesias, polyarthralgia 3.  Family history of cerebral aneurysm (Father) - patient does not have evidence of aneurysm on cerebral angiogram.   1.  Migraine prevention:  titrate nortriptyline to 50mg  at bedtime 2.  Migraine rescue:  She will stop rizatriptan and try Nurtec 3.  Limit use of pain relievers to no more than 2 days out of week to prevent risk of rebound or medication-overuse headache. 4.  Keep headache diary 5.  Follow up 6 months  History of Present Illness:  Andrea Boyer is a 40 year old right-handed female with asthma and history of kidney stones who follows up for migraines.  UPDATE: Started nortriptyline last visit. Intensity:  severe Duration:  2-3 days Frequency:  3 migraines in last 30 days; still with daily dull headache.  Current NSAIDS/analgesics:  Tylenol, meloxicam  (chronic pain) Current triptans:  rizatriptan 10mg , Current ergotamine:  none Current anti-emetic:  none Current muscle relaxants: tizanidine 4mg  TID Current Antihypertensive medications:  none Current Antidepressant medications:  nortriptyline 20mg  at bedtime Current Anticonvulsant medications:  Lyrica 75mg  BID (chronic pain) Current anti-CGRP:  none Current Vitamins/Herbal/Supplements:  none Current Antihistamines/Decongestants:  Zyrtec, hydroxyzine Other therapy:  none Hormone/birth control:  Mirena Other medications:  none  Due to worsening headaches and family history of cerebral aneurysms, she had MRA of head on 07/27/2020, personally reviewed, which showed a possible 1-2 mm medially projecting aneurysm arising from the cavernous right ICA.  She was referred to endovascular where she underwent a cerebral angiogram on 08/07/2020 which actually demonstrated no aneurysm or other vascular abnormality.    As she endorsed experiencing generalized paresthesias and pain, she underwent NCV-EMG on 09/15/2020 which showed no evidence of a polyneuropathy, but did demonstrate moderate to severe right carpal tunnel syndrome.  She endorsed experiencing particular numbness and discomfort in the right hand, so she was referred to a hand specialist.  Caffeine:  1/2 to 1 cup coffee in AM.  Occasional mini Coke Zero (2 a week).  1 cup unsweet tea daily Diet:  Hydrates.  Does not skip meals Exercise:  Not routine Depression:  Overall stable; Anxiety:  Some, improved Other pain:  History of chronic pain - polyarthralgia, burning and tingling in all extremities, neck pain radiating down arms.  Saw orthopedics.  Imaging of cervical spine reportedly showed mild disc bulges but nothing significant.  Did not respond to cervical nerve blocks. Sleep hygiene:  Improved but not optimal  HISTORY:  History of  migraines since 20 or 40 years old.  Over the past several months, they have progressively gotten worse.  She  describes severe stabbing pain usually across occipital region, other times diffuse.  There is associated photophobia, visual disturbance (bright yellow circles) and sometimes nausea and phonophobia.  They typically last 1-2 to 4- 5 days and occur 3 to 4 times a month.  However, she has a daily dull headache.  Imitrex and rest helps.  Previously took Tylenol frequently.  Missed 2-3 days of work in April 2022.   Remote MRI of brain from 09/27/2002 was reportedly unremarkable except for one possible punctate hyperintensity above the right lateral ventricle.      Past NSAIDS/analgesics:  Norco Past abortive triptans:  sumatriptan tab Past abortive ergotamine:  none Past muscle relaxants:  Robaxin Past anti-emetic:  none Past antihypertensive medications:  none Past antidepressant medications:  none Past anticonvulsant medications:  none Past anti-CGRP:  none Past vitamins/Herbal/Supplements:  none Past antihistamines/decongestants:  none Other past therapies:  none    Family history of headache:  Father - headaches.  Cerebral aneurysm  Past Medical History: Past Medical History:  Diagnosis Date   Anxiety    history - no current problems   Asthma    Elevated prolactin level 10/2006   NEGATIVE MRI   History of kidney stones    passes stones - no surgery required   IBS (irritable bowel syndrome)    Migraine    x many years   PIH (pregnancy induced hypertension) 06/2011   PIH resolved after delivery    Pneumonia 2010   PONV (postoperative nausea and vomiting)    Pregnancy induced hypertension    SVD (spontaneous vaginal delivery)    x 1    Medications: Outpatient Encounter Medications as of 01/22/2021  Medication Sig   acetaminophen (TYLENOL) 500 MG tablet Take 500 mg by mouth every 6 (six) hours as needed for mild pain or headache.   albuterol (VENTOLIN HFA) 108 (90 Base) MCG/ACT inhaler Inhale 2 puffs into the lungs every 6 (six) hours as needed for wheezing or shortness of  breath.   budesonide-formoterol (SYMBICORT) 160-4.5 MCG/ACT inhaler Inhale 2 puffs into the lungs 2 (two) times daily.   cephALEXin (KEFLEX) 500 MG capsule Take 1 capsule (500 mg total) by mouth 3 (three) times daily.   cetirizine (ZYRTEC) 10 MG tablet Take 10 mg by mouth daily as needed for allergies.   Galcanezumab-gnlm (EMGALITY) 120 MG/ML SOAJ Inject 120 mg into the skin every 30 (thirty) days.   hydrOXYzine (ATARAX/VISTARIL) 10 MG tablet Take 1 tablet (10 mg total) by mouth 3 (three) times daily as needed.   ipratropium-albuterol (DUONEB) 0.5-2.5 (3) MG/3ML SOLN Take 3 mLs by nebulization every 4 (four) hours as needed. (Patient taking differently: Take 3 mLs by nebulization every 4 (four) hours as needed (shortness of breath).)   levonorgestrel (MIRENA) 20 MCG/24HR IUD 1 each by Intrauterine route once.   meloxicam (MOBIC) 15 MG tablet Take 15 mg by mouth daily.   nortriptyline (PAMELOR) 10 MG capsule TAKE TWO CAPSULES BY MOUTH EVERY NIGHT AT BEDTIME   predniSONE (DELTASONE) 10 MG tablet 6/5/4/3/2/1 taper   pregabalin (LYRICA) 75 MG capsule Take 75 mg by mouth 2 (two) times daily.   rizatriptan (MAXALT) 10 MG tablet Take 1 tablet (10 mg total) by mouth as needed for migraine (May repeat in 2 hours.  Maximum 2 tablets in 24 hours.). May repeat in 2 hours if needed (Patient taking differently: Take 10 mg  by mouth as needed for migraine. May repeat in 2 hours.  Maximum 2 tablets in 24 hours.)   SUMAtriptan (IMITREX) 50 MG tablet sumatriptan 50 mg tablet (Patient not taking: Reported on 08/27/2020)   tiZANidine (ZANAFLEX) 4 MG tablet Take 4 mg by mouth at bedtime.   triamcinolone cream (KENALOG) 0.1 % Apply 1 application topically 2 (two) times daily.   No facility-administered encounter medications on file as of 01/22/2021.    Allergies: No Known Allergies  Family History: Family History  Problem Relation Age of Onset   Hypertension Mother    Arthritis Mother    Colon polyps Mother     Cancer Mother 21       colon   Diabetes Father    Hypertension Father    Colon polyps Father    Stroke Father    Diabetes Maternal Grandmother    COPD Maternal Grandmother    Hypertension Maternal Grandmother    Ovarian cysts Maternal Grandmother    Arthritis Maternal Grandmother    Heart disease Maternal Grandmother    Hypertension Maternal Grandfather    Hypertension Paternal Grandmother    Lung cancer Paternal Grandmother    Cancer Paternal Grandmother        lung   Hypertension Paternal Grandfather    Stomach cancer Paternal Grandfather    Cancer Paternal Grandfather        stomach   Muscular dystrophy Cousin    Diabetes Paternal Aunt    Anesthesia problems Neg Hx    Depression Neg Hx    Asthma Neg Hx     Observations/Objective:   Height 5\' 5"  (1.651 m), weight 160 lb (72.6 kg). No acute distress.  Alert and oriented.  Speech fluent and not dysarthric.  Language intact.     Follow Up Instructions:    -I discussed the assessment and treatment plan with the patient. The patient was provided an opportunity to ask questions and all were answered. The patient agreed with the plan and demonstrated an understanding of the instructions.   The patient was advised to call back or seek an in-person evaluation if the symptoms worsen or if the condition fails to improve as anticipated.   , DO

## 2021-01-22 ENCOUNTER — Other Ambulatory Visit: Payer: Self-pay

## 2021-01-22 ENCOUNTER — Telehealth (INDEPENDENT_AMBULATORY_CARE_PROVIDER_SITE_OTHER): Payer: 59 | Admitting: Neurology

## 2021-01-22 ENCOUNTER — Ambulatory Visit: Payer: 59 | Admitting: Neurology

## 2021-01-22 ENCOUNTER — Encounter: Payer: Self-pay | Admitting: Neurology

## 2021-01-22 VITALS — Ht 65.0 in | Wt 160.0 lb

## 2021-01-22 DIAGNOSIS — G43009 Migraine without aura, not intractable, without status migrainosus: Secondary | ICD-10-CM | POA: Insufficient documentation

## 2021-01-22 DIAGNOSIS — G894 Chronic pain syndrome: Secondary | ICD-10-CM

## 2021-01-22 DIAGNOSIS — G43709 Chronic migraine without aura, not intractable, without status migrainosus: Secondary | ICD-10-CM | POA: Diagnosis not present

## 2021-01-22 MED ORDER — NORTRIPTYLINE HCL 10 MG PO CAPS: 30.0000 mg | ORAL_CAPSULE | Freq: Every day | ORAL | 0 refills | Status: DC

## 2021-01-22 MED ORDER — NORTRIPTYLINE HCL 50 MG PO CAPS: 50.0000 mg | ORAL_CAPSULE | Freq: Every day | ORAL | 5 refills | Status: DC

## 2021-01-22 NOTE — Progress Notes (Signed)
Medication Samples have been provided to the patient.  Drug name: Nurtec       Strength: 75 mg        Qty: 2  LOT: [id number]  Exp.Date: 07/2023  Dosing instructions: as needed  The patient has been instructed regarding the correct time, dose, and frequency of taking this medication, including desired effects and most common side effects.   Leida Lauth 9:14 AM 01/22/2021

## 2021-01-22 NOTE — Patient Instructions (Signed)
Take nortriptyline 10mg  - take 3 pills at bedtime for 7 days, then STOP After finishing the 10mg  pills, start nortriptyline 50mg  - 1 tablet at bedtime If no improvement in headaches in 7 weeks, contact me When you get a migraine, take Nurtec - 1 pill daily as needed.  You can repeat next day if needed - let me know if it is effective Follow up 6 months.

## 2021-03-01 ENCOUNTER — Telehealth (INDEPENDENT_AMBULATORY_CARE_PROVIDER_SITE_OTHER): Payer: 59 | Admitting: Family Medicine

## 2021-03-01 ENCOUNTER — Encounter: Payer: Self-pay | Admitting: Family Medicine

## 2021-03-01 VITALS — BP 142/90 | HR 101 | Temp 100.2°F | Ht 65.0 in | Wt 160.0 lb

## 2021-03-01 DIAGNOSIS — J309 Allergic rhinitis, unspecified: Secondary | ICD-10-CM | POA: Diagnosis not present

## 2021-03-01 DIAGNOSIS — J3489 Other specified disorders of nose and nasal sinuses: Secondary | ICD-10-CM

## 2021-03-01 DIAGNOSIS — J4540 Moderate persistent asthma, uncomplicated: Secondary | ICD-10-CM

## 2021-03-01 DIAGNOSIS — Z20828 Contact with and (suspected) exposure to other viral communicable diseases: Secondary | ICD-10-CM

## 2021-03-01 NOTE — Patient Instructions (Signed)
Stay well hydrated. Recheck your fever when you can (with a thermometer you feel is more accurate).  Advil cold and sinus may be decreasing your temperature--check your temperature prior to your next dose of advil for a more accurate reading. If you truly have a fever, this could be an early sign of the flu (without other symptoms... yet). Please let us know if you develop flu symptoms (body aches, high fever, runny nose, cough, etc). Given your asthma, you are at higher risk for complications, and if within the first 48 hours of symptoms, would be a good candidate for Tamiflu.  We discussed sinus pain to include sinus congestion, and infections--viral vs bacterial. I think it sounds more like your sinuses are plugged--painful because they are full, but not necessarily indicating a bacterial infection at this point. We discussed using Sinus Rinse kit or Neti-pot to help with sinus pain/pressure (be sure to use distilled/bottled or boiled water), up to twice daily, if helping. Consider using Mucinex (guaifenesin), an expectorant that can also help thin out mucus and help with sinus pain (and chest congestion if it moves down to your chest). You don't need the DM version since you aren't coughing.  If you develop higher fever, body aches, and worsening respiratory symptoms, it is likely influenza ("the flu") If you don't get those features, just discolored mucus from the nose and ongoing sinus pain, then it may a sinus infection that needs an antibiotic.  Use flonase daily.  You may overlap with the zyrtec for the first week while waiting for the flonase to be effective.

## 2021-03-01 NOTE — Progress Notes (Signed)
Start time: 1:30 End time: 1:54  Virtual Visit via Video Note  I connected with Andrea Boyer on 03/01/21 by a video enabled telemedicine application and verified that I am speaking with the correct person using two identifiers.  Location: Patient: work Provider: office   I discussed the limitations of evaluation and management by telemedicine and the availability of in person appointments. The patient expressed understanding and agreed to proceed.  History of Present Illness:  Chief Complaint  Patient presents with   Facial Pain    VIRTUAL possible sinus infection. Pain in right eye and face, entire right side if face and over to her ear-symptoms started Friday. 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     Friday 12/9 she started with pain at her R cheek, sinus pressure.  This continued to get worse over the weekend.  Now her teeth hurt, extending to her R ear.  Hurts where her glasses touch her R cheek, and a little pain behind her R eye. She isn't having any nasal drainage, denies PND. She has just a slight sore throat. No cough.  She has been taking advil cold and sinus since Friday. It made things even drier, not sure if it helped at all.  She has had some allergy symptoms over the last week or two, though not much today. Used flonase last night for the first time in a while, hasn't been using zyrtec.  +sick exposures. Nephew and mother- and father-inlaw had the flu last week.  Son had been with them, remains asymptomatic.  Around nephew yesterday who got sick last night, an diagnosed with flu today.  PMH, PSH, SH reviewed  She works HR and accounts payable at [employer] Vaccines UTD (flu and COVID)  Pt with asthma and allergies, as well as migraines and fibromyalgia.  Outpatient Encounter Medications as of 03/01/2021  Medication Sig Note   acetaminophen (TYLENOL) 500 MG tablet Take 500 mg by mouth every 6 (six) hours as  needed for mild pain or headache. 03/01/2021: Last dose 8am   budesonide-formoterol (SYMBICORT) 160-4.5 MCG/ACT inhaler Inhale 2 puffs into the lungs 2 (two) times daily. 03/01/2021: Uses it mostly just once daily, BID only prn   fluticasone (FLONASE) 50 MCG/ACT nasal spray Place into both nostrils daily. 03/01/2021: Uses it sporadically, took last night, not recently otherwise   Galcanezumab-gnlm (EMGALITY) 120 MG/ML SOAJ Inject 120 mg into the skin every 30 (thirty) days.    levonorgestrel (MIRENA) 20 MCG/24HR IUD 1 each by Intrauterine route once.    meloxicam (MOBIC) 15 MG tablet Take 15 mg by mouth daily. 03/01/2021: Takes one every morning.   nortriptyline (PAMELOR) 50 MG capsule Take 1 capsule (50 mg total) by mouth at bedtime.    pregabalin (LYRICA) 75 MG capsule Take 75 mg by mouth 2 (two) times daily.    tiZANidine (ZANAFLEX) 4 MG tablet Take 4 mg by mouth at bedtime.    albuterol (VENTOLIN HFA) 108 (90 Base) MCG/ACT inhaler Inhale 2 puffs into the lungs every 6 (six) hours as needed for wheezing or shortness of breath. (Patient not taking: Reported on 03/01/2021) 03/01/2021: Last needed this 2 nights ago, uses prn   cetirizine (ZYRTEC) 10 MG tablet Take 10 mg by mouth daily as needed for allergies. (Patient not taking: Reported on 03/01/2021)    ipratropium-albuterol (DUONEB) 0.5-2.5 (3) MG/3ML SOLN Take 3 mLs by nebulization every 4 (four) hours as needed. (Patient not taking: Reported on  03/01/2021) 03/01/2021: Uses as needed   Pseudoephedrine-Ibuprofen 30-200 MG TABS Take 2 tablets by mouth as needed. (Patient not taking: Reported on 03/01/2021) 03/01/2021: Advil Cold and Sinus-last dose last night @ 8pm   rizatriptan (MAXALT) 10 MG tablet Take 1 tablet (10 mg total) by mouth as needed for migraine (May repeat in 2 hours.  Maximum 2 tablets in 24 hours.). May repeat in 2 hours if needed (Patient not taking: Reported on 03/01/2021) 03/01/2021: As needed for migraine   triamcinolone cream  (KENALOG) 0.1 % Apply 1 application topically 2 (two) times daily. (Patient not taking: Reported on 03/01/2021) 03/01/2021: Uses as needed   [DISCONTINUED] hydrOXYzine (ATARAX/VISTARIL) 10 MG tablet Take 1 tablet (10 mg total) by mouth 3 (three) times daily as needed.    [DISCONTINUED] nortriptyline (PAMELOR) 10 MG capsule Take 3 capsules (30 mg total) by mouth at bedtime for 7 days.    [DISCONTINUED] predniSONE (DELTASONE) 10 MG tablet 6/5/4/3/2/1 taper    [DISCONTINUED] SUMAtriptan (IMITREX) 50 MG tablet  (Patient not taking: Reported on 03/01/2021)    No facility-administered encounter medications on file as of 03/01/2021.   No Known Allergies  ROS: no known fever (suspects the thermometer used at work is reading high, often has LG temps with this thermometer at work, when not sick, per pt). R cheek pain per HPI.  No n/v/d, shortness of breath, chest pain, rashes or other concerns. See HPI.  Observations/Objective:  BP (!) 142/90   Pulse (!) 101   Temp 100.2 F (37.9 C) (Temporal)   Ht 5\' 5"  (1.651 m)   Wt 160 lb (72.6 kg)   BMI 26.63 kg/m    Well-appearing female. She is alert and oriented, and in no distress No coughing, throat-clearing, sniffling, doesn't sound congested Cranial nerves grossly intact.   Normal mood, affect, grooming, eye contact and speech Exam is limited due to virtual nature of the visit.   Assessment and Plan:  Sinus pain - Ddx reviewed--congestion vs infection (viral vs bacterial). Sinus rinses, mucinex. Contact if persists/worsens  Allergic rhinitis, unspecified seasonality, unspecified trigger - to cont flonase regularly, and zyrtec to overlap.   Exposure to influenza - has multiple flu exposures. If she develops fever and URI symptoms, to contact us for rx for Tamiflu if w/in 48 hrs. Pt with asthma  Moderate persistent asthma without complication - cont asthma meds.  Due to asthma dx, if develops flu symptoms, treat with tamiflu    Follow  Up Instructions:    I discussed the assessment and treatment plan with the patient. The patient was provided an opportunity to ask questions and all were answered. The patient agreed with the plan and demonstrated an understanding of the instructions.   The patient was advised to call back or seek an in-person evaluation if the symptoms worsen or if the condition fails to improve as anticipated.  I spent 28 minutes dedicated to the care of this patient, including pre-visit review of records, face to face time, post-visit ordering of testing and documentation.    [place], MD

## 2021-03-26 ENCOUNTER — Encounter: Payer: Self-pay | Admitting: Internal Medicine

## 2021-03-26 ENCOUNTER — Other Ambulatory Visit: Payer: Self-pay

## 2021-03-26 ENCOUNTER — Ambulatory Visit: Payer: Managed Care, Other (non HMO) | Admitting: Family Medicine

## 2021-03-26 VITALS — BP 150/100 | HR 122 | Temp 99.0°F | Wt 176.8 lb

## 2021-03-26 DIAGNOSIS — R030 Elevated blood-pressure reading, without diagnosis of hypertension: Secondary | ICD-10-CM

## 2021-03-26 NOTE — Patient Instructions (Signed)
20 minutes of something physical daily or 150 minutes a week of something physical.  Check out the DASH diet Come back in a month and bring your blood pressure cuff

## 2021-03-26 NOTE — Progress Notes (Signed)
° °  Subjective:    Patient ID: Andrea Boyer, female    DOB: 1980-06-11, 41 y.o.   MRN: [id number]  HPI She is here for consult concerning her blood pressure.  She does have a BP cuff at home but has not measured it against ours or any other medical office.  She has noted over the last month or so that her blood pressure has remained elevated.  She has concerns about this.   Review of Systems     Objective:   Physical Exam Alert and in no distress.  Blood pressure is recorded.       Assessment & Plan:  Elevated blood pressure reading I explained checking blood pressure and the technique behind it including resting position sitting, feet on the floor arm at heart level.  Discussed how often this should be done.  She is to bring her blood pressure cuff in with her in 1 month and recheck her blood pressure.

## 2021-04-27 ENCOUNTER — Ambulatory Visit: Payer: Managed Care, Other (non HMO) | Admitting: Medical

## 2021-04-27 ENCOUNTER — Encounter: Payer: Self-pay | Admitting: Medical

## 2021-04-27 ENCOUNTER — Other Ambulatory Visit: Payer: Self-pay

## 2021-04-27 VITALS — BP 142/98 | HR 96 | Temp 98.1°F | Wt 174.0 lb

## 2021-04-27 DIAGNOSIS — F419 Anxiety disorder, unspecified: Secondary | ICD-10-CM

## 2021-04-27 DIAGNOSIS — Z8759 Personal history of other complications of pregnancy, childbirth and the puerperium: Secondary | ICD-10-CM

## 2021-04-27 DIAGNOSIS — Z6828 Body mass index (BMI) 28.0-28.9, adult: Secondary | ICD-10-CM | POA: Diagnosis not present

## 2021-04-27 DIAGNOSIS — I10 Essential (primary) hypertension: Secondary | ICD-10-CM | POA: Diagnosis not present

## 2021-04-27 DIAGNOSIS — G479 Sleep disorder, unspecified: Secondary | ICD-10-CM

## 2021-04-27 DIAGNOSIS — R519 Headache, unspecified: Secondary | ICD-10-CM

## 2021-04-27 DIAGNOSIS — G8929 Other chronic pain: Secondary | ICD-10-CM

## 2021-04-27 DIAGNOSIS — R5383 Other fatigue: Secondary | ICD-10-CM

## 2021-04-27 MED ORDER — METOPROLOL SUCCINATE ER 25 MG PO TB24: 25.0000 mg | ORAL_TABLET | Freq: Every day | ORAL | 2 refills | Status: DC

## 2021-04-27 NOTE — Progress Notes (Signed)
Subjective:  Andrea Boyer is a 41 y.o. female who presents for Chief Complaint  Patient presents with   other    F/u on BP still a little elevated      Here for concern for elevated BPs.   She notes at least 3-4 months having some elevated readings.   Since recent visit with Dr. Susann Givens here for elevated BPs, been checking pressures.   Yesterday 148/112 at dentist.  Has been seeing numbers at home all high.  All recent readings >140/90.   Occasionally feels a chest pain but attributes to anxiety.  Has hx/o anxiety.  Occasionally has dizziness.  Has always dealt with headaches for years.  Somedays doesn't have energy.  No known snoring but husband does note occasionally breathing heavy.   Has had clicking in jaw for years, but lately worse on right, pain in jaw.  Saw dentist recently and had xrays, advised nothing wrong with causing the problem other than needs a root canal.    She does notes being on blood pressure medicaiton in both prior pregnancies.   She notes they had to take her baby  a month early in last pregnancy due to pre-eclampsia.    She walks 2 days per week for exercise.  No heavy alcohol or salt intake.    Does feel restless or anxious often.  Works full time, has 2 children, always busy.   No other aggravating or relieving factors.    No other c/o.  Past Medical History:  Diagnosis Date   Anxiety    history - no current problems   Asthma    Elevated prolactin level 10/2006   NEGATIVE MRI   History of kidney stones    passes stones - no surgery required   IBS (irritable bowel syndrome)    Migraine    x many years   PIH (pregnancy induced hypertension) 06/2011   PIH resolved after delivery    Pneumonia 2010   PONV (postoperative nausea and vomiting)    Pregnancy induced hypertension    SVD (spontaneous vaginal delivery)    x 1   Current Outpatient Medications on File Prior to Visit  Medication Sig Dispense Refill   acetaminophen (TYLENOL) 500 MG tablet Take  500 mg by mouth every 6 (six) hours as needed for mild pain or headache.     albuterol (VENTOLIN HFA) 108 (90 Base) MCG/ACT inhaler Inhale 2 puffs into the lungs every 6 (six) hours as needed for wheezing or shortness of breath. 18 g 1   budesonide-formoterol (SYMBICORT) 160-4.5 MCG/ACT inhaler Inhale 2 puffs into the lungs 2 (two) times daily. 1 Inhaler 0   cetirizine (ZYRTEC) 10 MG tablet Take 10 mg by mouth daily as needed for allergies.     fluticasone (FLONASE) 50 MCG/ACT nasal spray Place into both nostrils daily.     Galcanezumab-gnlm (EMGALITY) 120 MG/ML SOAJ Inject 120 mg into the skin every 30 (thirty) days. 1.12 mL 2   ipratropium-albuterol (DUONEB) 0.5-2.5 (3) MG/3ML SOLN Take 3 mLs by nebulization every 4 (four) hours as needed. 360 mL 0   levonorgestrel (MIRENA) 20 MCG/24HR IUD 1 each by Intrauterine route once.     meloxicam (MOBIC) 15 MG tablet Take 15 mg by mouth daily.     nortriptyline (PAMELOR) 50 MG capsule Take 1 capsule (50 mg total) by mouth at bedtime. 60 capsule 5   pregabalin (LYRICA) 75 MG capsule Take 75 mg by mouth 2 (two) times daily.     Pseudoephedrine-Ibuprofen 30-200  MG TABS Take 2 tablets by mouth as needed.     rizatriptan (MAXALT) 10 MG tablet Take 1 tablet (10 mg total) by mouth as needed for migraine (May repeat in 2 hours.  Maximum 2 tablets in 24 hours.). May repeat in 2 hours if needed 10 tablet 5   tiZANidine (ZANAFLEX) 4 MG tablet Take 4 mg by mouth at bedtime.     triamcinolone cream (KENALOG) 0.1 % Apply 1 application topically 2 (two) times daily. 45 g 0   No current facility-administered medications on file prior to visit.   Family History  Problem Relation Age of Onset   Hypertension Mother    Arthritis Mother    Colon polyps Mother    Cancer Mother 46       colon   Diabetes Father    Hypertension Father    Colon polyps Father    Stroke Father    Diabetes Maternal Grandmother    COPD Maternal Grandmother    Hypertension Maternal  Grandmother    Ovarian cysts Maternal Grandmother    Arthritis Maternal Grandmother    Heart disease Maternal Grandmother    Hypertension Maternal Grandfather    Hypertension Paternal Grandmother    Lung cancer Paternal Grandmother    Cancer Paternal Grandmother        lung   Hypertension Paternal Grandfather    Stomach cancer Paternal Grandfather    Cancer Paternal Grandfather        stomach   Muscular dystrophy Cousin    Diabetes Paternal Aunt    Anesthesia problems Neg Hx    Depression Neg Hx    Asthma Neg Hx      The following portions of the patient's history were reviewed and updated as appropriate: allergies, current medications, past family history, past medical history, past social history, past surgical history and problem list.  ROS Otherwise as in subjective above  Objective: BP (!) 142/98    Pulse 96    Temp 98.1 F (36.7 C)    Wt 174 lb (78.9 kg)    BMI 28.96 kg/m   BP Readings from Last 3 Encounters:  04/27/21 (!) 142/98  03/26/21 (!) 150/100  03/01/21 (!) 142/90   Wt Readings from Last 3 Encounters:  04/27/21 174 lb (78.9 kg)  03/26/21 176 lb 12.8 oz (80.2 kg)  03/01/21 160 lb (72.6 kg)    General appearance: alert, no distress, well developed, well nourished, white female HEENT: normocephalic, sclerae anicteric, conjunctiva pink and moist, TMs pearly, nares patent, no discharge or erythema, pharynx normal Oral cavity: MMM, no lesions Neck: supple, no lymphadenopathy, no thyromegaly, no masses, no bruits Heart: RRR, normal S1, S2, no murmurs Lungs: CTA bilaterally, no wheezes, rhonchi, or rales Pulses: 2+ radial pulses, 2+ pedal pulses, normal cap refill Ext: no edema Neuro: cn2-12 intact, nonfocal exam  EKG Indication, new diagnosis of hypertension.  Rate 94 bpm, PR 150 ms, QRS 84 ms, QTc 442 ms, axis 64 degrees, normal sinus rhythm    Assessment: Encounter Diagnoses  Name Primary?   Hypertension, unspecified type Yes   History of  pre-eclampsia    BMI 28.0-28.9,adult    Anxiety    Chronic intractable headache, unspecified headache type    Other fatigue    Sleep disturbance      Plan: Hypertension - Discussed diagnosis, possible complications, treatment recommendations including regular aerobic exercise, low salt, low fat diet, dietary sodium restriction, working towards a health weight, and need for medications.  Begin medication Toprol  XL 25mg  daily.  discussed risks/benefits of medication.  Evidence of target organ damage: none.    Check blood pressures  3 times weekly and record.  Follow up in 4-6 weeks.  BMI >28 - work on efforts to lose weight with low fat diet, increase exercise frequency  chronic headaches - managed by Dr. Everlena Cooper  Anxiety - work on ways to decrease stress, increase exercise  Referral to neurology for sleep evaluation, prefer in lab sleep study given some of her jaw clenching and possible movement disorder and fatigue  Laketra was seen today for other.  Diagnoses and all orders for this visit:  Hypertension, unspecified type -     Ambulatory referral to Neurology -     Urinalysis, Routine w reflex microscopic -     TSH  History of pre-eclampsia  BMI 28.0-28.9,adult -     Ambulatory referral to Neurology  Anxiety  Chronic intractable headache, unspecified headache type -     Ambulatory referral to Neurology  Other fatigue -     Ambulatory referral to Neurology  Sleep disturbance -     Ambulatory referral to Neurology  Other orders -     metoprolol succinate (TOPROL XL) 25 MG 24 hr tablet; Take 1 tablet (25 mg total) by mouth daily.    Follow up: pending labs

## 2021-04-28 ENCOUNTER — Other Ambulatory Visit: Payer: Self-pay | Admitting: Family Medicine

## 2021-04-28 LAB — TSH: TSH: 2.39 u[IU]/mL (ref 0.450–4.500)

## 2021-04-28 LAB — MICROSCOPIC EXAMINATION
Casts: NONE SEEN /lpf
RBC, Urine: NONE SEEN /hpf (ref 0–2)

## 2021-04-28 LAB — URINALYSIS, ROUTINE W REFLEX MICROSCOPIC
Bilirubin, UA: NEGATIVE
Glucose, UA: NEGATIVE
Ketones, UA: NEGATIVE
Nitrite, UA: NEGATIVE
RBC, UA: NEGATIVE
Specific Gravity, UA: 1.025 (ref 1.005–1.030)
Urobilinogen, Ur: 0.2 mg/dL (ref 0.2–1.0)
pH, UA: 6 (ref 5.0–7.5)

## 2021-04-28 MED ORDER — BUDESONIDE-FORMOTEROL FUMARATE 160-4.5 MCG/ACT IN AERO: 2.0000 | INHALATION_SPRAY | Freq: Two times a day (BID) | RESPIRATORY_TRACT | 0 refills | Status: AC

## 2021-04-28 NOTE — Addendum Note (Signed)
Addended by: Jac Canavan on: 04/28/2021 01:54 PM   Modules accepted: Orders

## 2021-06-08 ENCOUNTER — Ambulatory Visit: Payer: Managed Care, Other (non HMO) | Admitting: Medical

## 2021-06-08 NOTE — Progress Notes (Deleted)
? ?  ?  Here for concern for elevated BPs.   She notes at least 3-4 months having some elevated readings.   Since recent visit with Dr. Redmond School here for elevated BPs, been checking pressures.   Yesterday 148/112 at dentist.  Has been seeing numbers at home all high.  All recent readings >140/90.   Occasionally feels a chest pain but attributes to anxiety.  Has hx/o anxiety.  Occasionally has dizziness.  Has always dealt with headaches for years.  Somedays doesn't have energy.  No known snoring but husband does note occasionally breathing heavy.   Has had clicking in jaw for years, but lately worse on right, pain in jaw.  Saw dentist recently and had xrays, advised nothing wrong with causing the problem other than needs a root canal.   ?  ?She does notes being on blood pressure medicaiton in both prior pregnancies.   She notes they had to take her baby  a month early in last pregnancy due to pre-eclampsia.   ?  ?She walks 2 days per week for exercise.  No heavy alcohol or salt intake.   ?  ?Does feel restless or anxious often.  Works full time, has 2 children, always busy.  ?  ?No other aggravating or relieving factors.   ?  ?No other c/o. ? ? ?Plan: ?Hypertension - Discussed diagnosis, possible complications, treatment recommendations including regular aerobic exercise, low salt, low fat diet, dietary sodium restriction, working towards a health weight, and need for medications.  Begin medication Toprol XL 25mg  daily.  discussed risks/benefits of medication.  Evidence of target organ damage: none.    Check blood pressures  3 times weekly and record.  Follow up in 4-6 weeks. ?  ?BMI >28 - work on efforts to lose weight with low fat diet, increase exercise frequency ?  ?chronic headaches - managed by Dr. Tomi Likens ?  ?Anxiety - work on ways to decrease stress, increase exercise ?  ?Referral to neurology for sleep evaluation, prefer in lab sleep study given some of her jaw clenching and possible movement disorder and  fatigue ?

## 2021-06-11 ENCOUNTER — Encounter: Payer: Self-pay | Admitting: Medical

## 2021-07-01 ENCOUNTER — Institutional Professional Consult (permissible substitution): Payer: Managed Care, Other (non HMO) | Admitting: Neurology

## 2021-08-25 ENCOUNTER — Ambulatory Visit: Payer: Self-pay | Admitting: Neurology

## 2021-08-25 NOTE — Progress Notes (Signed)
NEUROLOGY FOLLOW UP OFFICE NOTE  Bhavana Kady [id number]  Assessment/Plan:   1.  Migraine without aura, without status migrainosus, not intractable 2.  Chronic pain syndrome- burning, numbness and paresthesias, polyarthralgia 3.  Sinus tachycardia, recurrent lightheadedness, syncope  4.  Right sided trigeminal neuralgia 5.  Family history of cerebral aneurysm (Father) - patient does not have evidence of aneurysm on cerebral angiogram. 6  Chronic pain syndrome Given symptoms of recurrent syncope, tachycardia and chronic pain, question if she may have POTS   Patient tachycardic in office.  Passed out when she was stuck for blood draw.  Pulse 70s.  Sending to ED Due to possible underlying undiagnosed cardiac pathology, will discontinue nortriptyline and rizatriptan Plan to start Aimovig 140mg  every 28 days For migraine rescue, will have her try samples of Ubrelvy 100mg  Due to previous trigeminal neuralgia and ongoing pain/paresthesias, will check MRI of brain with and without contrast Check labs - ANA, sed rate, RF, B12, Lyme Going to ED but will need cardiology follow up Follow up 4 months.  Subjective:  Nygeria Lager is a 41 year old right-handed female with asthma and history of kidney stones who follows up for migraines.   UPDATE: Last visit, titrated nortriptyline to 50mg  at bedtime.  Overall migraines are better.   Intensity:  severe Duration:  2-3 days Frequency:  1 migraine in last 30 days; has a dull headache 2-3 days a week.    She was diagnosed with HTN and started on metoprolol earlier this year.  Last week, her BP was elevated 170s/120s and lisinopril-HCTZ was added.  She hospitalized at [facility] 2 days ago due to chest pain and syncope.  When EMS arrived, they had difficulty getting a pulse.  In the ED, pulse was 110-130.  CT head unremarkable.  CTA chest negative for PE.  2D Echo reportedly unremarkable.  Troponins negative.  She is supposed to follow up  with cardiology ASAP.  Longstanding history of chronic pain.  Muscle spasms in arms and hands and paresthesias.  NCV-EMG last year was negative.    Between Thanksgiving and Christmas she had right sided burning and sharp/stinging facial pain.  Saw dentist and everything was ok.  No ear infection.  Lasted 3-4 days.  Current NSAIDS/analgesics:  Tylenol, meloxicam (chronic pain) Current triptans:  none Current ergotamine:  none Current anti-emetic:  none Current muscle relaxants: tizanidine 4mg  TID Current Antihypertensive medications:  none Current Antidepressant medications:  nortriptyline 50mg  at bedtime Current Anticonvulsant medications:  Lyrica 75mg  BID (chronic pain) Current anti-CGRP:  none Current Vitamins/Herbal/Supplements:  none Current Antihistamines/Decongestants:  Zyrtec, hydroxyzine Other therapy:  none Hormone/birth control:  Mirena Other medications:  none    Caffeine:  1/2 to 1 cup coffee in AM.  Occasional mini Coke Zero (2 a week).  1 cup unsweet tea daily Diet:  Hydrates.  Does not skip meals Exercise:  Not routine Depression:  Overall stable; Anxiety:  Some, improved Other pain:  History of chronic pain - polyarthralgia, burning and tingling in all extremities, neck pain radiating down arms.  Saw orthopedics.  Imaging of cervical spine reportedly showed mild disc bulges but nothing significant.  Did not respond to cervical nerve blocks. Sleep hygiene:  Improved but not optimal   HISTORY:  History of migraines since 70 or 41 years old.  Over the past several months, they have progressively gotten worse.  She describes severe stabbing pain usually across occipital region, other times diffuse.  There is associated photophobia, visual disturbance (  bright yellow circles) and sometimes nausea and phonophobia.  They typically last 1-2 to 4- 5 days and occur 3 to 4 times a month.  However, she has a daily dull headache.  Imitrex and rest helps.  Previously took Tylenol  frequently.  Missed 2-3 days of work in April 2022.   Remote MRI of brain from 09/27/2002 was reportedly unremarkable except for one possible punctate hyperintensity above the right lateral ventricle.  Due to worsening headaches and family history of cerebral aneurysms, she had MRA of head on 07/27/2020, personally reviewed, which showed a possible 1-2 mm medially projecting aneurysm arising from the cavernous right ICA.  She was referred to endovascular where she underwent a cerebral angiogram on 08/07/2020 which actually demonstrated no aneurysm or other vascular abnormality.    As she endorsed experiencing generalized paresthesias and pain, she underwent NCV-EMG on 09/15/2020 which showed no evidence of a polyneuropathy, but did demonstrate moderate to severe right carpal tunnel syndrome.  She endorsed experiencing particular numbness and discomfort in the right hand, so she was referred to a hand specialist.     Past NSAIDS/analgesics:  Norco Past abortive triptans:  sumatriptan tab Past abortive ergotamine:  none Past muscle relaxants:  Robaxin Past anti-emetic:  none Past antihypertensive medications:  none Past antidepressant medications:  none Past anticonvulsant medications:  none Past anti-CGRP:  none Past vitamins/Herbal/Supplements:  none Past antihistamines/decongestants:  none Other past therapies:  none     Family history of headache:  Father - headaches.  Cerebral aneurysm  PAST MEDICAL HISTORY: Past Medical History:  Diagnosis Date   Anxiety    history - no current problems   Asthma    Elevated prolactin level 10/2006   NEGATIVE MRI   History of kidney stones    passes stones - no surgery required   IBS (irritable bowel syndrome)    Migraine    x many years   PIH (pregnancy induced hypertension) 06/2011   PIH resolved after delivery    Pneumonia 2010   PONV (postoperative nausea and vomiting)    Pregnancy induced hypertension    SVD (spontaneous vaginal delivery)     x 1    MEDICATIONS: Current Outpatient Medications on File Prior to Visit  Medication Sig Dispense Refill   acetaminophen (TYLENOL) 500 MG tablet Take 500 mg by mouth every 6 (six) hours as needed for mild pain or headache.     albuterol (VENTOLIN HFA) 108 (90 Base) MCG/ACT inhaler Inhale 2 puffs into the lungs every 6 (six) hours as needed for wheezing or shortness of breath. 18 g 1   budesonide-formoterol (SYMBICORT) 160-4.5 MCG/ACT inhaler Inhale 2 puffs into the lungs 2 (two) times daily. 1 each 0   cetirizine (ZYRTEC) 10 MG tablet Take 10 mg by mouth daily as needed for allergies.     fluticasone (FLONASE) 50 MCG/ACT nasal spray Place into both nostrils daily.     Galcanezumab-gnlm (EMGALITY) 120 MG/ML SOAJ Inject 120 mg into the skin every 30 (thirty) days. 1.12 mL 2   ipratropium-albuterol (DUONEB) 0.5-2.5 (3) MG/3ML SOLN Take 3 mLs by nebulization every 4 (four) hours as needed. 360 mL 0   levonorgestrel (MIRENA) 20 MCG/24HR IUD 1 each by Intrauterine route once.     meloxicam (MOBIC) 15 MG tablet Take 15 mg by mouth daily.     metoprolol succinate (TOPROL XL) 25 MG 24 hr tablet Take 1 tablet (25 mg total) by mouth daily. 30 tablet 2   nortriptyline (PAMELOR) 50 MG capsule  Take 1 capsule (50 mg total) by mouth at bedtime. 60 capsule 5   pregabalin (LYRICA) 75 MG capsule Take 75 mg by mouth 2 (two) times daily.     Pseudoephedrine-Ibuprofen 30-200 MG TABS Take 2 tablets by mouth as needed.     rizatriptan (MAXALT) 10 MG tablet Take 1 tablet (10 mg total) by mouth as needed for migraine (May repeat in 2 hours.  Maximum 2 tablets in 24 hours.). May repeat in 2 hours if needed 10 tablet 5   tiZANidine (ZANAFLEX) 4 MG tablet Take 4 mg by mouth at bedtime.     triamcinolone cream (KENALOG) 0.1 % Apply 1 application topically 2 (two) times daily. 45 g 0   No current facility-administered medications on file prior to visit.    ALLERGIES: No Known Allergies  FAMILY HISTORY: Family  History  Problem Relation Age of Onset   Hypertension Mother    Arthritis Mother    Colon polyps Mother    Cancer Mother 94       colon   Diabetes Father    Hypertension Father    Colon polyps Father    Stroke Father    Diabetes Maternal Grandmother    COPD Maternal Grandmother    Hypertension Maternal Grandmother    Ovarian cysts Maternal Grandmother    Arthritis Maternal Grandmother    Heart disease Maternal Grandmother    Hypertension Maternal Grandfather    Hypertension Paternal Grandmother    Lung cancer Paternal Grandmother    Cancer Paternal Grandmother        lung   Hypertension Paternal Grandfather    Stomach cancer Paternal Grandfather    Cancer Paternal Grandfather        stomach   Muscular dystrophy Cousin    Diabetes Paternal Aunt    Anesthesia problems Neg Hx    Depression Neg Hx    Asthma Neg Hx       Objective:  Blood pressure 140/90, pulse (!) 135, height 5\' 4"  (1.626 m), SpO2 99 %. General: No acute distress.  Patient appears well-groomed.   Head:  Normocephalic/atraumatic Eyes:  Fundi examined but not visualized Neck: supple, no paraspinal tenderness, full range of motion Heart:  Regular rate and rhythm Lungs:  Clear to auscultation bilaterally Back: No paraspinal tenderness Neurological Exam: alert and oriented to person, place, and time.  Speech fluent and not dysarthric, language intact.  CN II-XII intact. Bulk and tone normal, muscle strength 5/5 throughout.  Sensation to light touch intact.  Deep tendon reflexes 2+ throughout, toes downgoing.  Finger to nose testing intact.  Gait normal, Romberg negative.   , DO

## 2021-08-27 ENCOUNTER — Ambulatory Visit (INDEPENDENT_AMBULATORY_CARE_PROVIDER_SITE_OTHER): Payer: 59 | Admitting: Neurology

## 2021-08-27 ENCOUNTER — Other Ambulatory Visit: Payer: 59

## 2021-08-27 ENCOUNTER — Encounter: Payer: Self-pay | Admitting: Neurology

## 2021-08-27 ENCOUNTER — Telehealth: Payer: Self-pay | Admitting: Neurology

## 2021-08-27 VITALS — BP 140/90 | HR 135 | Ht 64.0 in

## 2021-08-27 DIAGNOSIS — R55 Syncope and collapse: Secondary | ICD-10-CM

## 2021-08-27 DIAGNOSIS — G500 Trigeminal neuralgia: Secondary | ICD-10-CM | POA: Diagnosis not present

## 2021-08-27 DIAGNOSIS — G43009 Migraine without aura, not intractable, without status migrainosus: Secondary | ICD-10-CM | POA: Diagnosis not present

## 2021-08-27 DIAGNOSIS — G894 Chronic pain syndrome: Secondary | ICD-10-CM

## 2021-08-27 LAB — SEDIMENTATION RATE: Sed Rate: 13 mm/hr (ref 0–20)

## 2021-08-27 LAB — VITAMIN B12: Vitamin B-12: 597 pg/mL (ref 211–911)

## 2021-08-27 MED ORDER — AIMOVIG 140 MG/ML ~~LOC~~ SOAJ: 140.0000 mg | SUBCUTANEOUS | 5 refills | Status: AC

## 2021-08-27 NOTE — Telephone Encounter (Signed)
Spoke to [place] as well as went down to the lab at [address] to check on patient.   Patient BP 98/52 pulse 70. After she had blood drawn. Patient husband and well as father in law informed of patient condition and we called EMS.  Before I got back with a wheelchair EMS showed up. Patient advise by staff to please go to the ED and if she needs to she can has for a transfer to [facility] which is in Network with her insurance and where she was seen two days before.   Left patient with EMS to go back to [facility].

## 2021-08-27 NOTE — Telephone Encounter (Signed)
Kieth Brightly called from labs, patient got sick, feeling like she is going to pass out. I called and let sheena know.

## 2021-08-27 NOTE — Patient Instructions (Addendum)
Stop nortriptyline and rizatriptan. Start Aimovig 140mg  every 28 days AT earliest onset of migraine, take Ubrelvy.  May repeat after 2 hours (maximum 2 in 24 hours).  Let me know if helpful Check MRI brain with and without contrast.We have sent a referral to [facility] for your MRI and they will call you directly to schedule your appointment. They are located at [address] [facility]. If you need to contact them directly please call [phone].  Check ANA, sed rate, RF, B12, Lyme.Your provider has requested that you have labwork completed today. Please go to [facility] ([id number]) on the second floor of this building before leaving the office today. You do not need to check in. If you are not called within 15 minutes please check with the front desk.   Hold off on EMG for now. When you see cardiology, ask about POTS Follow up 4 months.

## 2021-08-28 LAB — RHEUMATOID FACTOR: Rheumatoid fact SerPl-aCnc: <14 IU/mL (ref <14)

## 2021-08-28 LAB — ANA W/REFLEX: Anti Nuclear Antibody (ANA): NEGATIVE

## 2021-08-30 LAB — LYME DISEASE, WESTERN BLOT
IgG P23 Ab.: ABSENT
IgG P28 Ab.: ABSENT
IgG P30 Ab.: ABSENT
IgG P39 Ab.: ABSENT
IgG P45 Ab.: ABSENT
IgG P58 Ab.: ABSENT
IgG P66 Ab.: ABSENT
IgM P23 Ab.: ABSENT
IgM P41 Ab.: ABSENT
Lyme IgG Wb: NEGATIVE
Lyme IgM Wb: NEGATIVE

## 2021-08-31 ENCOUNTER — Telehealth: Payer: Self-pay | Admitting: Neurology

## 2021-08-31 NOTE — Telephone Encounter (Signed)
The following message was left with [employer] on 08/31/21 at 12:50 PM.  Caller states she is having fainting episodes. Her GP wrote a note that she cannot drive or work until she is released from her neurologist and she needs an appointment.  Ok to schedule patient sooner?

## 2021-08-31 NOTE — Telephone Encounter (Signed)
Spoke to patient, Per Patient she was seen by her PCP on 08/30/21. PCP advised patient not to drive or Work until after her appt on 09/06/21 with Cardiology. Patient was given a heart monitor for two weeks. MRI rescheduled to two weeks out due her having to wear the heart monitor.  Patient states she doesn't feel any better then she did last week. Advised to let her PCP or go the ED if she gets any worse.   This was just a FYI for Dr.Jaffe.

## 2021-09-02 ENCOUNTER — Telehealth: Payer: Self-pay

## 2021-09-02 NOTE — Telephone Encounter (Signed)
Pt called with results Testing negative.  The Lyme test did show some positive values but did not meet criteria for positive Lyme disease

## 2021-09-02 NOTE — Telephone Encounter (Signed)
-----   Message from Drema [place], DO sent at 08/31/2021  3:21 PM EDT ----- Testing negative.  The Lyme test did show some positive values but did not meet criteria for positive Lyme disease.

## 2021-09-13 ENCOUNTER — Ambulatory Visit
Admission: RE | Admit: 2021-09-13 | Discharge: 2021-09-13 | Disposition: A | Discharge status: Home / Self Care | Payer: 59 | Source: Ambulatory Visit | Attending: Neurology | Admitting: Neurology

## 2021-09-13 DIAGNOSIS — G500 Trigeminal neuralgia: Secondary | ICD-10-CM

## 2021-09-13 DIAGNOSIS — R55 Syncope and collapse: Secondary | ICD-10-CM

## 2021-09-13 MED ORDER — GADOBENATE DIMEGLUMINE 529 MG/ML IV SOLN
15.0000 mL | Freq: Once | INTRAVENOUS | Status: AC | PRN
  Administered 2021-09-13: 15 mL via INTRAVENOUS

## 2021-11-24 ENCOUNTER — Encounter: Payer: Self-pay | Admitting: Internal Medicine

## 2021-12-28 ENCOUNTER — Encounter: Payer: Self-pay | Admitting: Internal Medicine

## 2022-01-12 ENCOUNTER — Ambulatory Visit: Payer: 59 | Admitting: Neurology

## 2022-05-05 ENCOUNTER — Encounter (HOSPITAL_BASED_OUTPATIENT_CLINIC_OR_DEPARTMENT_OTHER): Payer: Self-pay | Admitting: Cardiology

## 2022-05-05 ENCOUNTER — Ambulatory Visit (INDEPENDENT_AMBULATORY_CARE_PROVIDER_SITE_OTHER): Payer: 59 | Admitting: Cardiology

## 2022-05-05 VITALS — BP 138/86 | HR 93 | Ht 64.0 in | Wt 182.2 lb

## 2022-05-05 DIAGNOSIS — G90A Postural orthostatic tachycardia syndrome (POTS): Secondary | ICD-10-CM

## 2022-05-05 DIAGNOSIS — I10 Essential (primary) hypertension: Secondary | ICD-10-CM

## 2022-05-05 DIAGNOSIS — Z7189 Other specified counseling: Secondary | ICD-10-CM

## 2022-05-05 NOTE — Patient Instructions (Addendum)
Medication Instructions:  We will slowly stop the metoprolol. I would cut the metoprolol in half (so 12.5 mg twice daily) for a week, then take 12.5 mg once a day in the morning for a week, then stop.   *If you need a refill on your cardiac medications before your next appointment, please call your pharmacy*  Lab Work: None   Testing/Procedures: None   Follow-Up: At [facility], you and your health needs are our priority.  As part of our continuing mission to provide you with exceptional heart care, we have created designated Provider Care Teams.  These Care Teams include your primary Cardiologist (physician) and Advanced Practice Providers (APPs -  Physician Assistants and Nurse Practitioners) who all work together to provide you with the care you need, when you need it.  We recommend signing up for the patient portal called "MyChart".  Sign up information is provided on this After Visit Summary.  MyChart is used to connect with patients for Virtual Visits (Telemedicine).  Patients are able to view lab/test results, encounter notes, upcoming appointments, etc.  Non-urgent messages can be sent to your provider as well.   To learn more about what you can do with MyChart, go to [email or web address].    Your next appointment:   4 to 6 week(s)  Provider:   Buford Dresser, MD    Other Instructions  Postural Orthostatic Tachycardia Syndrome: Spent >20 minutes counseling specifically on the etiology, diagnosis, and symptom management of POTS. The following recommendations were emphasized: -avoid dehydration. Often it requires high volumes of fluids, often with salt/electrolytes included, to stay hydrated. People with POTS are very sensitive to fluid shifts and dehydration. Oral rehydration is preferred, and routine use of IV fluids is not recommended. -if tolerated, compression stocking can assist with fluid management and prevent pooling in the legs. -slow position changes  are recommended -if there is a feeling of severe lightheadedness, like near to passing out, recommend lying on the floor on the back, with legs elevated up on a chair or up against the wall. -the best long term management of POTS symptoms is gradual exercise conditioning. I recommend seated exercises such as bike to start, to avoid the risk of falling with lightheadedness. Exercise programs, either through supervised programs like cardiac rehab or through personal programs, should focus on gradually increasing exercise tolerance and conditioning.  -this is a link to specific exercise recommendations for POTS: http://www.smith-bell.org/ -we discussed the typical spectrum of dysautonomia, including typical populations, that this sometimes spontaneously improves with age (though a small percentage have persistent symptoms), that this has uncomfortable symptoms but is not associated with long term mortality, and that the etiology/treatment of this is an area of active research

## 2022-05-05 NOTE — Progress Notes (Signed)
Cardiology Office Note:    Date:  05/05/2022   ID:  Andrea Boyer, DOB 1981-03-19, MRN [id number]  PCP:  Joya Gaskins, FNP  Cardiologist:  Buford Dresser, MD  Referring MD: Joya Gaskins, *   CC: new patient consultation for orthostatic syncope  History of Present Illness:    Andrea Boyer is a 42 y.o. female with no cardiac history who is seen as a new consult at the request of Joya Gaskins, * for the evaluation and management of orthostatic syncope. Notes from referring provider personally reviewed.  Syncope: Yes Initial episode: Before June 2023 Presyncopal symptoms: Lightheadedness/Dizziness Post syncope symptoms: none Aggravating/alleviating factors: Orthostatically induced Pre-existing medical conditions: Pregnancy induced hypertension Potential medication/supplement interactions: None  Today, the patient states that she has had a hard time at work recently. She has always been very active and finds syncope to be difficult to cope with. She often has no energy. In June she was told she was medically unfit to drive. She often feels lightheaded and dizzy, sitting down resolves these symptoms. Lisinopril recently has improved her symptoms.  She is mainly active through her commitments with two kids and work, she doesn't exercise specifically. She feels that she is unable to clean and organize as much as she would like due to her fatigue/lethargy.  Her cousin had been diagnosed with POTS around the same time  She has had fever symptoms but only when sick, she does sweat often. No blood in stool/excessive bleeding  We discussed dysautonomia in relation to her syncopal symptoms at length.  She denies any palpitations, chest pain, shortness of breath, or peripheral edema. No headaches, orthopnea, or PND.   Past Medical History:  Diagnosis Date   Anxiety    history - no current problems   Asthma    Elevated prolactin level 10/2006   NEGATIVE  MRI   History of kidney stones    passes stones - no surgery required   IBS (irritable bowel syndrome)    Migraine    x many years   PIH (pregnancy induced hypertension) 06/2011   PIH resolved after delivery    Pneumonia 2010   PONV (postoperative nausea and vomiting)    Pregnancy induced hypertension    SVD (spontaneous vaginal delivery)    x 1    Past Surgical History:  Procedure Laterality Date   FOOT FRACTURE SURGERY Right 2004   removed a bone from a fx foot   IR ANGIO INTRA EXTRACRAN SEL COM CAROTID INNOMINATE BILAT MOD SED  08/07/2020   IR ANGIO VERTEBRAL SEL SUBCLAVIAN INNOMINATE BILAT MOD SED  08/07/2020   IR US GUIDE VASC ACCESS RIGHT  08/07/2020   LAPAROSCOPIC LYSIS OF ADHESIONS N/A 11/29/2012   Procedure: LAPAROSCOPIC LYSIS OF ADHESIONS;  Surgeon: Logan Bores, MD;  Location: [facility];  Service: Gynecology;  Laterality: N/A;   LAPAROSCOPY N/A 11/29/2012   Procedure: LAPAROSCOPY OPERATIVE;  Surgeon: Logan Bores, MD;  Location: [facility];  Service: Gynecology;  Laterality: N/A;   TONSILLECTOMY  1991   WISDOM TOOTH EXTRACTION      Current Medications: Current Outpatient Medications on File Prior to Visit  Medication Sig   acetaminophen (TYLENOL) 500 MG tablet Take 500 mg by mouth every 6 (six) hours as needed for mild pain or headache.   albuterol (VENTOLIN HFA) 108 (90 Base) MCG/ACT inhaler Inhale 2 puffs into the lungs every 6 (six) hours as needed for wheezing or shortness of breath.   budesonide-formoterol (SYMBICORT) 160-4.5 MCG/ACT  inhaler Inhale 2 puffs into the lungs 2 (two) times daily.   cetirizine (ZYRTEC) 10 MG tablet Take 10 mg by mouth daily as needed for allergies.   Erenumab-aooe (AIMOVIG) 140 MG/ML SOAJ Inject 140 mg into the skin every 28 (twenty-eight) days.   fluticasone (FLONASE) 50 MCG/ACT nasal spray Place into both nostrils daily.   ipratropium-albuterol (DUONEB) 0.5-2.5 (3) MG/3ML SOLN Take 3 mLs by nebulization every 4 (four) hours as  needed.   levonorgestrel (MIRENA) 20 MCG/24HR IUD 1 each by Intrauterine route once.   lisinopril-hydrochlorothiazide (ZESTORETIC) 10-12.5 MG tablet Take 1 tablet by mouth daily.   magnesium oxide (MAG-OX) 400 (240 Mg) MG tablet Take 400 mg by mouth daily.   meloxicam (MOBIC) 15 MG tablet Take 15 mg by mouth daily.   metoprolol tartrate (LOPRESSOR) 25 MG tablet Take 25 mg by mouth 2 (two) times daily.   pregabalin (LYRICA) 75 MG capsule Take 75 mg by mouth 2 (two) times daily.   tiZANidine (ZANAFLEX) 4 MG tablet Take 4 mg by mouth at bedtime.   No current facility-administered medications on file prior to visit.     Allergies:   Patient has no known allergies.   Social History   Tobacco Use   Smoking status: Never   Smokeless tobacco: Never  Vaping Use   Vaping Use: Never used  Substance Use Topics   Alcohol use: No   Drug use: No    Family History: family history includes Arthritis in her maternal grandmother and mother; COPD in her maternal grandmother; Cancer in her paternal grandfather and paternal grandmother; Cancer (age of onset: 26) in her mother; Colon polyps in her father and mother; Diabetes in her father, maternal grandmother, and paternal aunt; Heart disease in her maternal grandmother; Hypertension in her father, maternal grandfather, maternal grandmother, mother, paternal grandfather, and paternal grandmother; Lung cancer in her paternal grandmother; Muscular dystrophy in her cousin; Ovarian cysts in her maternal grandmother; Stomach cancer in her paternal grandfather; Stroke in her father. There is no history of Anesthesia problems, Depression, or Asthma.  ROS:   Please see the history of present illness.   (+) Syncope (+) Fatigue (+) Lightheadedness (+) Dizziness Additional pertinent ROS: Constitutional: Negative for chills, fever, night sweats, unintentional weight loss  HENT: Negative for ear pain and hearing loss.   Eyes: Negative for loss of vision and eye  pain.  Respiratory: Negative for cough, sputum, wheezing.   Cardiovascular: See HPI. Gastrointestinal: Negative for abdominal pain, melena, and hematochezia.  Genitourinary: Negative for dysuria and hematuria.  Musculoskeletal: Negative for falls and myalgias.  Skin: Negative for itching and rash.  Neurological: Negative for focal weakness, focal sensory changes and loss of consciousness.  Endo/Heme/Allergies: Does not bruise/bleed easily.      EKGs/Labs/Other Studies Reviewed:    The following studies were reviewed today: No prior cardiac studies  EKG:  EKG is personally reviewed.   05/05/2022: NSR at 93 bpm  Recent Labs: No results found for requested labs within last 365 days.  Recent Lipid Panel    Component Value Date/Time   CHOL 168 02/06/2020 1112   TRIG 62 02/06/2020 1112   HDL 54 02/06/2020 1112   CHOLHDL 3.1 02/06/2020 1112   LDLCALC 102 (H) 02/06/2020 1112    Physical Exam:    VS:  BP 138/86 (BP Location: Left Arm, Patient Position: Sitting, Cuff Size: Normal)   Pulse 93   Ht 5\' 4"  (1.626 m)   Wt 182 lb 3.2 oz (82.6 kg)  BMI 31.27 kg/m      Orthostatics Lying 133.85, HR 84 Sitting 137/84, HR 103 Standing 150/103, HR 103 Standing 3 min, HR 103  Wt Readings from Last 3 Encounters:  05/05/22 182 lb 3.2 oz (82.6 kg)  04/27/21 174 lb (78.9 kg)  03/26/21 176 lb 12.8 oz (80.2 kg)    GEN: Well nourished, well developed in no acute distress HEENT: Normal, moist mucous membranes NECK: No JVD CARDIAC: regular rhythm, normal S1 and S2, no rubs or gallops. No murmur. VASCULAR: Radial and DP pulses 2+ bilaterally. No carotid bruits RESPIRATORY:  Clear to auscultation without rales, wheezing or rhonchi  ABDOMEN: Soft, non-tender, non-distended MUSCULOSKELETAL:  Ambulates independently SKIN: Warm and dry, slight BLE edema  NEUROLOGIC:  Alert and oriented x 3. No focal neuro deficits noted. PSYCHIATRIC:  Normal affect    ASSESSMENT:    1. POTS (postural  orthostatic tachycardia syndrome)   2. Essential hypertension   3. Cardiac risk counseling   4. Counseling on health promotion and disease prevention    PLAN:    POTS/dysautonomia -does not meet strictly by criteria on orthostatics today, but symptoms highly suggestive -did education at length today:  Postural Orthostatic Tachycardia Syndrome: Spent >20 minutes counseling specifically on the etiology, diagnosis, and symptom management of POTS. The following recommendations were emphasized: -avoid dehydration. Often it requires high volumes of fluids, often with salt/electrolytes included, to stay hydrated. People with POTS are very sensitive to fluid shifts and dehydration. Oral rehydration is preferred, and routine use of IV fluids is not recommended. -if tolerated, compression stocking can assist with fluid management and prevent pooling in the legs. -slow position changes are recommended -if there is a feeling of severe lightheadedness, like near to passing out, recommend lying on the floor on the back, with legs elevated up on a chair or up against the wall. -the best long term management of POTS symptoms is gradual exercise conditioning. I recommend seated exercises such as bike to start, to avoid the risk of falling with lightheadedness. Exercise programs, either through supervised programs like cardiac rehab or through personal programs, should focus on gradually increasing exercise tolerance and conditioning.  -this is a link to specific exercise recommendations for POTS: http://www.smith-bell.org/ -we discussed the typical spectrum of dysautonomia, including typical populations, that this sometimes spontaneously improves with age (though a small percentage have persistent symptoms), that this has uncomfortable symptoms but is not associated with long term mortality, and that the etiology/treatment of this is an  area of active research   Hypertension -taper off metoprolol -currently on lisinopril-HCTZ. Given dysautonomia, would change to lisinopril alone (may need to uptitrate) and stop HCTZ  Cardiac risk counseling and prevention recommendations: -recommend heart healthy/Mediterranean diet, with whole grains, fruits, vegetable, fish, lean meats, nuts, and olive oil. Limit salt. -recommend moderate walking, 3-5 times/week for 30-50 minutes each session. Aim for at least 150 minutes.week. Goal should be pace of 3 miles/hours, or walking 1.5 miles in 30 minutes -recommend avoidance of tobacco products. Avoid excess alcohol. -ASCVD risk score: The ASCVD Risk score (Arnett DK, et al., 2019) failed to calculate for the following reasons:   The 2019 ASCVD risk score is only valid for ages 19 to 69    Plan for follow up: 4-6 weeks   Buford Dresser, MD, PhD, [facility] at [facility] at [facility] [address], [place], [place] [address] [phone]   Medication Adjustments/Labs  and Tests Ordered: Current medicines are reviewed at length with the patient today.  Concerns regarding medicines are outlined above.  Orders Placed This Encounter  Procedures   EKG 12-Lead   No orders of the defined types were placed in this encounter.   Patient Instructions  Medication Instructions:  We will slowly stop the metoprolol. I would cut the metoprolol in half (so 12.5 mg twice daily) for a week, then take 12.5 mg once a day in the morning for a week, then stop.   *If you need a refill on your cardiac medications before your next appointment, please call your pharmacy*  Lab Work: None   Testing/Procedures: None   Follow-Up: At [facility], you and your health needs are our priority.  As part of our continuing mission to provide you with exceptional heart care, we have created designated  Provider Care Teams.  These Care Teams include your primary Cardiologist (physician) and Advanced Practice Providers (APPs -  Physician Assistants and Nurse Practitioners) who all work together to provide you with the care you need, when you need it.  We recommend signing up for the patient portal called "MyChart".  Sign up information is provided on this After Visit Summary.  MyChart is used to connect with patients for Virtual Visits (Telemedicine).  Patients are able to view lab/test results, encounter notes, upcoming appointments, etc.  Non-urgent messages can be sent to your provider as well.   To learn more about what you can do with MyChart, go to [email or web address].    Your next appointment:   4 to 6 week(s)  Provider:   Buford Dresser, MD    Other Instructions  Postural Orthostatic Tachycardia Syndrome: Spent >20 minutes counseling specifically on the etiology, diagnosis, and symptom management of POTS. The following recommendations were emphasized: -avoid dehydration. Often it requires high volumes of fluids, often with salt/electrolytes included, to stay hydrated. People with POTS are very sensitive to fluid shifts and dehydration. Oral rehydration is preferred, and routine use of IV fluids is not recommended. -if tolerated, compression stocking can assist with fluid management and prevent pooling in the legs. -slow position changes are recommended -if there is a feeling of severe lightheadedness, like near to passing out, recommend lying on the floor on the back, with legs elevated up on a chair or up against the wall. -the best long term management of POTS symptoms is gradual exercise conditioning. I recommend seated exercises such as bike to start, to avoid the risk of falling with lightheadedness. Exercise programs, either through supervised programs like cardiac rehab or through personal programs, should focus on gradually increasing exercise tolerance and conditioning.   -this is a link to specific exercise recommendations for POTS: http://www.smith-bell.org/ -we discussed the typical spectrum of dysautonomia, including typical populations, that this sometimes spontaneously improves with age (though a small percentage have persistent symptoms), that this has uncomfortable symptoms but is not associated with long term mortality, and that the etiology/treatment of this is an area of active research     I,Coren O'Brien,acting as a Education administrator for [employer], MD.,have documented all relevant documentation on the behalf of Buford Dresser, MD,as directed by  Buford Dresser, MD while in the presence of Buford Dresser, MD.  I, Buford Dresser, MD, have reviewed all documentation for this visit. The documentation on 06/05/22 for the exam, diagnosis, procedures, and orders are all accurate and complete.

## 2022-05-31 ENCOUNTER — Ambulatory Visit (HOSPITAL_BASED_OUTPATIENT_CLINIC_OR_DEPARTMENT_OTHER): Payer: 59 | Admitting: Cardiology

## 2023-07-21 ENCOUNTER — Other Ambulatory Visit: Payer: Self-pay | Admitting: Obstetrics and Gynecology

## 2023-07-21 DIAGNOSIS — R928 Other abnormal and inconclusive findings on diagnostic imaging of breast: Secondary | ICD-10-CM

## 2023-09-15 ENCOUNTER — Encounter (HOSPITAL_COMMUNITY): Payer: Self-pay | Admitting: Interventional Radiology
